# Patient Record
Sex: Female | Born: 1960 | Race: Black or African American | Hispanic: No | Marital: Single | Smoking: Never smoker
Health system: Southern US, Community
[De-identification: ages and names within clinical notes are randomized; demographics above are authoritative.]

## PROBLEM LIST (undated history)

## (undated) DIAGNOSIS — R011 Cardiac murmur, unspecified: Secondary | ICD-10-CM

## (undated) DIAGNOSIS — D219 Benign neoplasm of connective and other soft tissue, unspecified: Secondary | ICD-10-CM

## (undated) DIAGNOSIS — F32A Depression, unspecified: Secondary | ICD-10-CM

## (undated) DIAGNOSIS — G4730 Sleep apnea, unspecified: Secondary | ICD-10-CM

## (undated) DIAGNOSIS — I10 Essential (primary) hypertension: Secondary | ICD-10-CM

## (undated) DIAGNOSIS — E785 Hyperlipidemia, unspecified: Secondary | ICD-10-CM

## (undated) DIAGNOSIS — M1990 Unspecified osteoarthritis, unspecified site: Secondary | ICD-10-CM

## (undated) DIAGNOSIS — K219 Gastro-esophageal reflux disease without esophagitis: Secondary | ICD-10-CM

## (undated) DIAGNOSIS — F329 Major depressive disorder, single episode, unspecified: Secondary | ICD-10-CM

## (undated) DIAGNOSIS — J40 Bronchitis, not specified as acute or chronic: Secondary | ICD-10-CM

## (undated) DIAGNOSIS — F419 Anxiety disorder, unspecified: Secondary | ICD-10-CM

## (undated) HISTORY — DX: Anxiety disorder, unspecified: F41.9

## (undated) HISTORY — PX: CARPAL TUNNEL RELEASE: SHX101

## (undated) HISTORY — DX: Cardiac murmur, unspecified: R01.1

## (undated) HISTORY — PX: INDUCED ABORTION: SHX677

## (undated) HISTORY — PX: ABDOMINAL HYSTERECTOMY: SHX81

## (undated) HISTORY — DX: Essential (primary) hypertension: I10

## (undated) HISTORY — PX: TOTAL ABDOMINAL HYSTERECTOMY: SHX209

## (undated) HISTORY — DX: Unspecified osteoarthritis, unspecified site: M19.90

## (undated) HISTORY — DX: Hyperlipidemia, unspecified: E78.5

## (undated) HISTORY — DX: Sleep apnea, unspecified: G47.30

## (undated) HISTORY — DX: Gastro-esophageal reflux disease without esophagitis: K21.9

---

## 2011-05-16 ENCOUNTER — Inpatient Hospital Stay (HOSPITAL_COMMUNITY): Payer: Self-pay

## 2011-05-16 ENCOUNTER — Inpatient Hospital Stay (HOSPITAL_COMMUNITY)
Admission: AD | Admit: 2011-05-16 | Discharge: 2011-05-16 | Disposition: A | Discharge status: Home / Self Care | Payer: Self-pay | Source: Ambulatory Visit | Attending: Obstetrics and Gynecology | Admitting: Obstetrics and Gynecology

## 2011-05-16 ENCOUNTER — Encounter (HOSPITAL_COMMUNITY): Payer: Self-pay | Admitting: *Deleted

## 2011-05-16 DIAGNOSIS — Z90710 Acquired absence of both cervix and uterus: Secondary | ICD-10-CM | POA: Insufficient documentation

## 2011-05-16 DIAGNOSIS — M549 Dorsalgia, unspecified: Secondary | ICD-10-CM | POA: Insufficient documentation

## 2011-05-16 DIAGNOSIS — R1031 Right lower quadrant pain: Secondary | ICD-10-CM | POA: Insufficient documentation

## 2011-05-16 HISTORY — DX: Depression, unspecified: F32.A

## 2011-05-16 HISTORY — DX: Bronchitis, not specified as acute or chronic: J40

## 2011-05-16 HISTORY — DX: Benign neoplasm of connective and other soft tissue, unspecified: D21.9

## 2011-05-16 HISTORY — DX: Major depressive disorder, single episode, unspecified: F32.9

## 2011-05-16 LAB — URINALYSIS, ROUTINE W REFLEX MICROSCOPIC
Bilirubin Urine: NEGATIVE
Glucose, UA: NEGATIVE mg/dL
Hgb urine dipstick: NEGATIVE
Ketones, ur: NEGATIVE mg/dL
Leukocytes, UA: NEGATIVE
Nitrite: NEGATIVE
Protein, ur: NEGATIVE mg/dL
Specific Gravity, Urine: 1.01 (ref 1.005–1.030)
Urobilinogen, UA: 0.2 mg/dL (ref 0.0–1.0)
pH: 7 (ref 5.0–8.0)

## 2011-05-16 LAB — CBC
MCV: 87.9 fL (ref 78.0–100.0)
Platelets: 197 10*3/uL (ref 150–400)
RBC: 3.9 MIL/uL (ref 3.87–5.11)
WBC: 5.5 10*3/uL (ref 4.0–10.5)

## 2011-05-16 MED ORDER — KETOROLAC TROMETHAMINE 60 MG/2ML IM SOLN
60.0000 mg | Freq: Once | INTRAMUSCULAR | Status: AC
  Administered 2011-05-16: 60 mg via INTRAMUSCULAR
  Filled 2011-05-16: qty 2

## 2011-05-16 MED ORDER — CYCLOBENZAPRINE HCL 10 MG PO TABS: 10.0000 mg | ORAL_TABLET | Freq: Three times a day (TID) | ORAL | Status: AC | PRN

## 2011-05-16 NOTE — ED Provider Notes (Signed)
History     Chief Complaint  Patient presents with  . Abdominal Pain   HPI Ms. Carrender is a 50 yo [place] American women with PMH of HTN and PSH hysterectomy in 2000 for uterine fibroids who presents with one month history of RLQ pain.  Rates pain at 10/10 today.  States the pain began as RLQ pain but has now progressed to pain in her right lower back as well.  Reports the pain as constant since onset and describes it as intermittent sharp contraction-like pain.  Denies radiation of pain.  Denies any eliciting injury or event.  Reports some nausea but no vomiting.  Reports occasional urinary frequency since the onset of this pain but denies dysuria or urgency.  Denies vaginal bleeding, discharge, pruritis.  Denies diarrhea or constipation.  Pain relieved when laying on right side with pillow under the affected area.  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  Pain not relieved with Tylenol.  Patient was seen in Aug 2012 ED with similar complaint and states that no diagnosis was determined at that time, reports appendix was normal and [place] tech was unable to visual ovary.    OB History    Grav Para Term Preterm Abortions TAB SAB Ect Mult Living   3 1 1  0 2 2 0 0 0 1      Past Medical History  Diagnosis Date  . Bronchitis     reports hx of frequent  . Fibroid   . Depression     no meds    Past Surgical History  Procedure Date  . Abdominal hysterectomy   . Carpal tunnel release     bilateral  . Induced abortion     X2    No family history on file.  History  Substance Use Topics  . Smoking status: Never Smoker   . Smokeless tobacco: Not on file  . Alcohol Use: No    Allergies:  Allergies  Allergen Reactions  . Neurontin (Gabapentin) Rash    Prescriptions prior to admission  Medication Sig Dispense Refill  . Cholecalciferol (VITAMIN D-3 PO) Take 1 tablet by mouth 2 (two) times daily.          Review of Systems  Constitutional: Negative for fever, chills and  weight loss.  HENT: Negative.  Negative for neck pain.   Eyes: Negative.   Respiratory: Negative.   Cardiovascular: Negative.   Gastrointestinal: Positive for nausea, vomiting and abdominal pain (RLQ "contraction-like" pain). Negative for heartburn, diarrhea, constipation and blood in stool.  Genitourinary: Positive for frequency. Negative for dysuria, urgency, hematuria and flank pain.  Musculoskeletal: Positive for back pain (Right lumbosacral pain x34month). Negative for myalgias, joint pain and falls.  Skin: Negative.   Neurological: Negative.   Endo/Heme/Allergies: Negative.   Psychiatric/Behavioral: Negative.    Physical Exam   Blood pressure 150/93, pulse 67, temperature 98.8 F (37.1 C), temperature source Oral, resp. rate 20, height 6\' 1"  (1.854 m), weight 212 lb 6.4 oz (96.344 kg). CBC    Component Value Date/Time   WBC 5.5 05/16/2011 0930   RBC 3.90 05/16/2011 0930   HGB 11.7* 05/16/2011 0930   HCT 34.3* 05/16/2011 0930   PLT 197 05/16/2011 0930   MCV 87.9 05/16/2011 0930   MCH 30.0 05/16/2011 0930   MCHC 34.1 05/16/2011 0930   RDW 13.0 05/16/2011 0930    Physical Exam  Constitutional: She is oriented to person, place, and time. She appears well-developed and well-nourished. She appears distressed (  Patient appears in significant discomfort).  HENT:  Head: Normocephalic and atraumatic.  Eyes: Conjunctivae and EOM are normal. No scleral icterus.  Neck: Normal range of motion. Neck supple. No tracheal deviation present.  Cardiovascular: Normal rate, regular rhythm and intact distal pulses.  Exam reveals no gallop and no friction rub.   No murmur heard. Respiratory: Effort normal and breath sounds normal. No stridor. No respiratory distress. She has no wheezes. She has no rales.  GI: Soft. Bowel sounds are normal. She exhibits distension and mass (difficult to determine due to guarding ). There is tenderness. There is guarding. There is no rebound.         Patient tender to  palpation in RLQ.  Tender to both superficial and deep palpation.  Voluntary guarding in this region.  Unable to determine if there was mass in RLQ due to patient discomfort and guarding. Palpation of LLQ elicited some discomfort in RLQ.  RUQ and LUQ nontender to palpation.      Musculoskeletal: Normal range of motion. She exhibits tenderness (Tenderness in right lumbosacral region). She exhibits no edema.  Neurological: She is alert and oriented to person, place, and time. She has normal reflexes.  Skin: Skin is warm and dry.  Psychiatric: She has a normal mood and affect. Her behavior is normal. Judgment and thought content normal.   Transvaginal/Transabdominal Ultrasound: The uterus is surgically absent. Neither ovary is  identified and may also be surgically absent. There are no adnexal  masses or free pelvic fluid. Peristalsing bowel is seen within the  pelvis.   MAU Course  Procedures  MDM CBC: reveals no elevation in WBC or right shift.  Slight anemia with Hgb of 11.7 UA: reveals no signs of urinary tract infection Transabdominal and Transvaginal pelvic ultrasounds reveal surgically absent uterus and ovaries.  No free pelvic fluid.  Assessment and Plan  Ms. Attar is a 50 yo woman who presented to the MAU with a one month history of abdominal and back pain that was beginning to effect her ADLs. Lab testing and imaging reveal no pelvic or genitourinary etiology.  Suspect pain is due to musculoskeletal in origin.  Flexeril rx given and patient advised to follow-up with gyn or primary care if issue continues.     Clinton Gallant. Rice III, DrHSc, MPAS, PA-C  05/16/2011, 10:12 AM   Henrietta Hoover, PA 05/16/11 1735

## 2011-05-16 NOTE — ED Notes (Signed)
Rice PA in discussing results. And plan of care.

## 2011-05-16 NOTE — Progress Notes (Signed)
Went to ER in [place] twice last month, had US,CT and eval- nothing seen.  [facility] in [place].  Has appt at GYN clinic is 10/19, was told could come here prior to appt.  Pain initially in RLQ, now is around to back and is like a catch in her back.   Had hysterectomy ~2000, for fibroids, still has ovaries.

## 2011-05-16 NOTE — Progress Notes (Signed)
abd pain, first started in 2011, went to EDx1, then didn't bother for a while, returned in August this year.  Visits to [facility] ED x2 in Aug, 3 wks apart.

## 2011-05-16 NOTE — ED Notes (Signed)
Took hormones for a while, but a year after hysterectomy the doctor did blood work and she was "post-menopausal"

## 2011-05-24 NOTE — ED Provider Notes (Signed)
Agree with above note.  Denise Mejia 05/24/2011 9:17 AM   

## 2011-06-23 ENCOUNTER — Encounter: Payer: Self-pay | Admitting: Obstetrics and Gynecology

## 2011-08-02 ENCOUNTER — Encounter: Payer: Self-pay | Admitting: Advanced Practice Midwife

## 2013-02-08 DIAGNOSIS — G5600 Carpal tunnel syndrome, unspecified upper limb: Secondary | ICD-10-CM

## 2013-02-08 DIAGNOSIS — G43909 Migraine, unspecified, not intractable, without status migrainosus: Secondary | ICD-10-CM

## 2013-02-08 HISTORY — DX: Carpal tunnel syndrome, unspecified upper limb: G56.00

## 2013-02-08 HISTORY — DX: Migraine, unspecified, not intractable, without status migrainosus: G43.909

## 2013-04-09 ENCOUNTER — Ambulatory Visit: Payer: Self-pay | Admitting: Cardiology

## 2013-05-13 ENCOUNTER — Encounter: Payer: Self-pay | Admitting: Cardiology

## 2013-05-13 ENCOUNTER — Encounter: Payer: Self-pay | Admitting: *Deleted

## 2013-05-13 DIAGNOSIS — F329 Major depressive disorder, single episode, unspecified: Secondary | ICD-10-CM | POA: Insufficient documentation

## 2013-05-13 DIAGNOSIS — D219 Benign neoplasm of connective and other soft tissue, unspecified: Secondary | ICD-10-CM | POA: Insufficient documentation

## 2013-05-13 DIAGNOSIS — J40 Bronchitis, not specified as acute or chronic: Secondary | ICD-10-CM | POA: Insufficient documentation

## 2013-05-14 ENCOUNTER — Encounter: Payer: Self-pay | Admitting: Cardiology

## 2013-05-14 ENCOUNTER — Ambulatory Visit (INDEPENDENT_AMBULATORY_CARE_PROVIDER_SITE_OTHER): Payer: Self-pay | Admitting: Cardiology

## 2013-05-14 VITALS — BP 140/80 | HR 54 | Wt 228.0 lb

## 2013-05-14 DIAGNOSIS — R011 Cardiac murmur, unspecified: Secondary | ICD-10-CM

## 2013-05-14 DIAGNOSIS — R0609 Other forms of dyspnea: Secondary | ICD-10-CM

## 2013-05-14 DIAGNOSIS — I10 Essential (primary) hypertension: Secondary | ICD-10-CM | POA: Insufficient documentation

## 2013-05-14 DIAGNOSIS — R0600 Dyspnea, unspecified: Secondary | ICD-10-CM

## 2013-05-14 HISTORY — DX: Cardiac murmur, unspecified: R01.1

## 2013-05-14 HISTORY — DX: Essential (primary) hypertension: I10

## 2013-05-14 NOTE — Assessment & Plan Note (Signed)
No murmur noted on examination including with Valsalva. No further evaluation.

## 2013-05-14 NOTE — Progress Notes (Signed)
  HPI: 52 year old female with no prior cardiac history for evaluation of murmur. She has dyspnea with more extreme activities but not routine activities. No orthopnea, PND, pedal edema, palpitations, syncope or exertional chest pain.  Current Outpatient Prescriptions  Medication Sig Dispense Refill  . albuterol (VENTOLIN HFA) 108 (90 BASE) MCG/ACT inhaler Inhale 2 puffs into the lungs every 6 (six) hours as needed for wheezing.      . Cholecalciferol (VITAMIN D-3 PO) Take 1 tablet by mouth 2 (two) times daily.        . cloNIDine (CATAPRES) 0.1 MG tablet Take 0.1 mg by mouth at bedtime.      . metoprolol (LOPRESSOR) 50 MG tablet Take 50 mg by mouth 2 (two) times daily.      Marland Kitchen spironolactone (ALDACTONE) 25 MG tablet Take 25 mg by mouth daily.       No current facility-administered medications for this visit.    Allergies  Allergen Reactions  . Amlodipine   . Hctz [Hydrochlorothiazide]   . Lisinopril     cough  . Tramadol   . Neurontin [Gabapentin] Rash    Past Medical History  Diagnosis Date  . Bronchitis     reports hx of frequent  . Fibroid   . Depression     no meds  . Hypertension   . GERD (gastroesophageal reflux disease)     Past Surgical History  Procedure Laterality Date  . Abdominal hysterectomy    . Carpal tunnel release      bilateral  . Induced abortion      X2    History   Social History  . Marital Status: Married    Spouse Name: N/A    Number of Children: 1  . Years of Education: N/A   Occupational History  .      Nurses aide   Social History Main Topics  . Smoking status: Never Smoker   . Smokeless tobacco: Not on file  . Alcohol Use: No  . Drug Use: No  . Sexual Activity: Yes    Birth Control/ Protection: Surgical   Other Topics Concern  . Not on file   Social History Narrative  . No narrative on file    Family History  Problem Relation Age of Onset  . Stroke Mother     ROS: no fevers or chills, productive cough, hemoptysis,  dysphasia, odynophagia, melena, hematochezia, dysuria, hematuria, rash, seizure activity, orthopnea, PND, pedal edema, claudication. Remaining systems are negative.  Physical Exam:   Blood pressure 140/80, pulse 54, weight 228 lb (103.42 kg).  General:  Well developed/well nourished in NAD Skin warm/dry Patient not depressed No peripheral clubbing Back-normal HEENT-normal/normal eyelids Neck supple/normal carotid upstroke bilaterally; no bruits; no JVD; no thyromegaly chest - CTA/ normal expansion CV - RRR/normal S1 and S2; no murmurs, rubs or gallops;  PMI nondisplaced Abdomen -NT/ND, no HSM, no mass, + bowel sounds, no bruit 2+ femoral pulses, no bruits Ext-no edema, chords, 2+ DP Neuro-grossly nonfocal  ECG Sinus bradycardia at a rate of 54. Left ventricular hypertrophy. Nonspecific ST changes.

## 2013-05-14 NOTE — Assessment & Plan Note (Signed)
Blood pressure is controlled. Continue present medications. Left ventricular hypertrophy is noted on electrocardiogram and mild dyspnea with more extreme activities. Check echocardiogram for LV function and LV wall thickness.

## 2013-05-14 NOTE — Patient Instructions (Signed)
Your physician has requested that you have an echocardiogram. Echocardiography is a painless test that uses sound waves to create images of your heart. It provides your doctor with information about the size and shape of your heart and how well your heart's chambers and valves are working. This procedure takes approximately one hour. There are no restrictions for this procedure.  [address]  Your physician recommends that you continue on your current medications as directed. Please refer to the Current Medication list given to you today.   Follow up as needed

## 2013-05-28 ENCOUNTER — Other Ambulatory Visit (HOSPITAL_COMMUNITY): Payer: Self-pay

## 2014-02-18 ENCOUNTER — Emergency Department (HOSPITAL_BASED_OUTPATIENT_CLINIC_OR_DEPARTMENT_OTHER): Payer: No Typology Code available for payment source

## 2014-02-18 ENCOUNTER — Encounter (HOSPITAL_BASED_OUTPATIENT_CLINIC_OR_DEPARTMENT_OTHER): Payer: Self-pay | Admitting: Emergency Medicine

## 2014-02-18 ENCOUNTER — Emergency Department (HOSPITAL_BASED_OUTPATIENT_CLINIC_OR_DEPARTMENT_OTHER)
Admission: EM | Admit: 2014-02-18 | Discharge: 2014-02-18 | Disposition: A | Discharge status: Home / Self Care | Payer: No Typology Code available for payment source | Attending: Emergency Medicine | Admitting: Emergency Medicine

## 2014-02-18 DIAGNOSIS — J069 Acute upper respiratory infection, unspecified: Secondary | ICD-10-CM | POA: Insufficient documentation

## 2014-02-18 DIAGNOSIS — Z8719 Personal history of other diseases of the digestive system: Secondary | ICD-10-CM | POA: Insufficient documentation

## 2014-02-18 DIAGNOSIS — H9209 Otalgia, unspecified ear: Secondary | ICD-10-CM | POA: Insufficient documentation

## 2014-02-18 DIAGNOSIS — Z8709 Personal history of other diseases of the respiratory system: Secondary | ICD-10-CM | POA: Insufficient documentation

## 2014-02-18 DIAGNOSIS — Z8659 Personal history of other mental and behavioral disorders: Secondary | ICD-10-CM | POA: Insufficient documentation

## 2014-02-18 DIAGNOSIS — Z8742 Personal history of other diseases of the female genital tract: Secondary | ICD-10-CM | POA: Insufficient documentation

## 2014-02-18 DIAGNOSIS — I10 Essential (primary) hypertension: Secondary | ICD-10-CM | POA: Insufficient documentation

## 2014-02-18 DIAGNOSIS — Z79899 Other long term (current) drug therapy: Secondary | ICD-10-CM | POA: Insufficient documentation

## 2014-02-18 MED ORDER — BENZONATATE 100 MG PO CAPS: 100.0000 mg | ORAL_CAPSULE | Freq: Three times a day (TID) | ORAL | Status: DC

## 2014-02-18 MED ORDER — FLUTICASONE PROPIONATE 50 MCG/ACT NA SUSP: 1.0000 | Freq: Every day | NASAL | Status: DC

## 2014-02-18 MED ORDER — ALBUTEROL SULFATE HFA 108 (90 BASE) MCG/ACT IN AERS: 2.0000 | INHALATION_SPRAY | RESPIRATORY_TRACT | Status: DC | PRN

## 2014-02-18 MED ORDER — PREDNISONE 20 MG PO TABS: 40.0000 mg | ORAL_TABLET | Freq: Every day | ORAL | Status: DC

## 2014-02-18 NOTE — ED Notes (Signed)
Symptoms started with earache and then sore throat. Pt complaining now of sob and headache.

## 2014-02-18 NOTE — Discharge Instructions (Signed)
Upper Respiratory Infection, Adult  An upper respiratory infection (URI) is also known as the common cold. It is often caused by a type of germ (virus). Colds are easily spread (contagious). You can pass it to others by kissing, coughing, sneezing, or drinking out of the same glass. Usually, you get better in 1 or 2 weeks.   HOME CARE   · Only take medicine as told by your doctor.  · Use a warm mist humidifier or breathe in steam from a hot shower.  · Drink enough water and fluids to keep your pee (urine) clear or pale yellow.  · Get plenty of rest.  · Return to work when your temperature is back to normal or as told by your doctor. You may use a face mask and wash your hands to stop your cold from spreading.  GET HELP RIGHT AWAY IF:   · After the first few days, you feel you are getting worse.  · You have questions about your medicine.  · You have chills, shortness of breath, or brown or red spit (mucus).  · You have yellow or brown snot (nasal discharge) or pain in the face, especially when you bend forward.  · You have a fever, puffy (swollen) neck, pain when you swallow, or white spots in the back of your throat.  · You have a bad headache, ear pain, sinus pain, or chest pain.  · You have a high-pitched whistling sound when you breathe in and out (wheezing).  · You have a lasting cough or cough up blood.  · You have sore muscles or a stiff neck.  MAKE SURE YOU:   · Understand these instructions.  · Will watch your condition.  · Will get help right away if you are not doing well or get worse.  Document Released: 02/07/2008 Document Revised: 11/13/2011 Document Reviewed: 12/26/2010  ExitCare® Patient Information ©2015 [employer]. This information is not intended to replace advice given to you by your health care provider. Make sure you discuss any questions you have with your health care provider.  Antibiotic Nonuse   Your caregiver felt that the infection or problem was not one that would be helped with an  antibiotic.  Infections may be caused by viruses or bacteria. Only a caregiver can tell which one of these is the likely cause of an illness. A cold is the most common cause of infection in both adults and children. A cold is a virus. Antibiotic treatment will have no effect on a viral infection. Viruses can lead to many lost days of work caring for sick children and many missed days of school. Children may catch as many as 10 "colds" or "flus" per year during which they can be tearful, cranky, and uncomfortable. The goal of treating a virus is aimed at keeping the ill person comfortable.  Antibiotics are medications used to help the body fight bacterial infections. There are relatively few types of bacteria that cause infections but there are hundreds of viruses. While both viruses and bacteria cause infection they are very different types of germs. A viral infection will typically go away by itself within 7 to 10 days. Bacterial infections may spread or get worse without antibiotic treatment.  Examples of bacterial infections are:  · Sore throats (like strep throat or tonsillitis).  · Infection in the lung (pneumonia).  · Ear and skin infections.  Examples of viral infections are:  · Colds or flus.  · Most coughs and bronchitis.  ·   Sore throats not caused by Strep.  · Runny noses.  It is often best not to take an antibiotic when a viral infection is the cause of the problem. Antibiotics can kill off the helpful bacteria that we have inside our body and allow harmful bacteria to start growing. Antibiotics can cause side effects such as allergies, nausea, and diarrhea without helping to improve the symptoms of the viral infection. Additionally, repeated uses of antibiotics can cause bacteria inside of our body to become resistant. That resistance can be passed onto harmful bacterial. The next time you have an infection it may be harder to treat if antibiotics are used when they are not needed. Not treating with  antibiotics allows our own immune system to develop and take care of infections more efficiently. Also, antibiotics will work better for us when they are prescribed for bacterial infections.  Treatments for a child that is ill may include:  · Give extra fluids throughout the day to stay hydrated.  · Get plenty of rest.  · Only give your child over-the-counter or prescription medicines for pain, discomfort, or fever as directed by your caregiver.  · The use of a cool mist humidifier may help stuffy noses.  · Cold medications if suggested by your caregiver.  Your caregiver may decide to start you on an antibiotic if:  · The problem you were seen for today continues for a longer length of time than expected.  · You develop a secondary bacterial infection.  SEEK MEDICAL CARE IF:  · Fever lasts longer than 5 days.  · Symptoms continue to get worse after 5 to 7 days or become severe.  · Difficulty in breathing develops.  · Signs of dehydration develop (poor drinking, rare urinating, dark colored urine).  · Changes in behavior or worsening tiredness (listlessness or lethargy).  Document Released: 10/30/2001 Document Revised: 11/13/2011 Document Reviewed: 04/28/2009  ExitCare® Patient Information ©2015 [employer]. This information is not intended to replace advice given to you by your health care provider. Make sure you discuss any questions you have with your health care provider.

## 2014-02-18 NOTE — ED Provider Notes (Signed)
CSN: [id number]     Arrival date & time 02/18/14  0857 History   First MD Initiated Contact with Patient 02/18/14 [phone]     Chief Complaint  Patient presents with  . Shortness of Breath     (Consider location/radiation/quality/duration/timing/severity/associated sxs/prior Treatment) HPI Comments: The patient presented to the ER for evaluation of difficulty breathing. Patient reports that she has been sick for 2 or 3 days. She started with bilateral ear pain and then the next day developed a sore throat. Today she has had cough and felt short of breath. Patient does report a history of using an inhaler with upper respiratory infections, but no persistent asthma or COPD. She has not had a fever.  Patient is a 53 y.o. female presenting with shortness of breath.  Shortness of Breath Associated symptoms: cough, ear pain and sore throat     Past Medical History  Diagnosis Date  . Bronchitis     reports hx of frequent  . Fibroid   . Depression     pt states she does not have history of depresssion 02-18-2014  . Hypertension   . GERD (gastroesophageal reflux disease)    Past Surgical History  Procedure Laterality Date  . Abdominal hysterectomy    . Carpal tunnel release      bilateral  . Induced abortion      X2   Family History  Problem Relation Age of Onset  . Stroke Mother    History  Substance Use Topics  . Smoking status: Never Smoker   . Smokeless tobacco: Not on file  . Alcohol Use: No   OB History   Grav Para Term Preterm Abortions TAB SAB Ect Mult Living   3 1 1  0 2 2 0 0 0 1     Review of Systems  HENT: Positive for ear pain and sore throat.   Respiratory: Positive for cough and shortness of breath.   All other systems reviewed and are negative.     Allergies  Amlodipine; Hctz; Lisinopril; Tramadol; and Neurontin  Home Medications   Prior to Admission medications   Medication Sig Start Date End Date Taking? Authorizing Provider  albuterol (VENTOLIN  HFA) 108 (90 BASE) MCG/ACT inhaler Inhale 2 puffs into the lungs every 6 (six) hours as needed for wheezing.   Yes Historical Provider, MD  Cholecalciferol (VITAMIN D-3 PO) Take 1 tablet by mouth 2 (two) times daily.     Yes Historical Provider, MD  cloNIDine (CATAPRES) 0.1 MG tablet Take 0.1 mg by mouth at bedtime.   Yes Historical Provider, MD   BP 161/79  Pulse 92  Temp(Src) 98.6 F (37 C) (Oral)  Resp 18  Ht 6' (1.829 m)  Wt 228 lb (103.42 kg)  BMI 30.92 kg/m2  SpO2 100% Physical Exam  Constitutional: She is oriented to person, place, and time. She appears well-developed and well-nourished. No distress.  HENT:  Head: Normocephalic and atraumatic.  Right Ear: Hearing normal.  Left Ear: Hearing normal.  Nose: Nose normal.  Mouth/Throat: Oropharynx is clear and moist and mucous membranes are normal.  Eyes: Conjunctivae and EOM are normal. Pupils are equal, round, and reactive to light.  Neck: Normal range of motion. Neck supple.  Cardiovascular: Regular rhythm, S1 normal and S2 normal.  Exam reveals no gallop and no friction rub.   No murmur heard. Pulmonary/Chest: Effort normal and breath sounds normal. No respiratory distress. She exhibits no tenderness.  Abdominal: Soft. Normal appearance and bowel sounds are normal. There  is no hepatosplenomegaly. There is no tenderness. There is no rebound, no guarding, no tenderness at McBurney's point and negative Murphy's sign. No hernia.  Musculoskeletal: Normal range of motion.  Neurological: She is alert and oriented to person, place, and time. She has normal strength. No cranial nerve deficit or sensory deficit. Coordination normal. GCS eye subscore is 4. GCS verbal subscore is 5. GCS motor subscore is 6.  Skin: Skin is warm, dry and intact. No rash noted. No cyanosis.  Psychiatric: She has a normal mood and affect. Her speech is normal and behavior is normal. Thought content normal.    ED Course  Procedures (including critical care  time) Labs Review Labs Reviewed - No data to display  Imaging Review Dg Chest 2 View  02/18/2014   CLINICAL DATA:  Cough.  EXAM: CHEST  2 VIEW  COMPARISON:  11/20/2013 .  FINDINGS: Mediastinum hilar structures normal. Lungs are clear. Heart size normal. No pleural effusion or pneumothorax.  IMPRESSION: No acute cardiopulmonary disease.   Electronically Signed   By: Marcello Moores  Register   On: 02/18/2014 09:35     EKG Interpretation None      MDM   Final diagnoses:  None  URI  Patient presents to ER for evaluation for respiratory symptoms. She started with ear pain, followed by sore throat and now chest congestion and difficulty breathing. She reports a history of asthmatic bronchitis, but no ongoing issues of asthma or COPD. Patient does not have active bronchospasm on examination. Oxygenation is 100%. Based on the concomitant upper aspect of her symptoms including sore throat, ear pain and congestion, PE is not considered likely. There is nothing to suggest acute coronary syndrome or cardiac etiology of her shortness of breath. Chest x-ray was obtained and there is no evidence of pneumonia. Patient will be treated with prednisone and bronchodilator therapy, symptomatic treatment for cough and congestion.    Orpah Greek, MD 02/18/14 [phone]

## 2014-07-06 ENCOUNTER — Encounter (HOSPITAL_BASED_OUTPATIENT_CLINIC_OR_DEPARTMENT_OTHER): Payer: Self-pay | Admitting: Emergency Medicine

## 2014-08-19 ENCOUNTER — Encounter: Payer: No Typology Code available for payment source | Admitting: Cardiology

## 2014-08-19 NOTE — Progress Notes (Signed)
      HPI: follow-up hypertension. Last seen in September 2014. Echocardiogram ordered but not performed.   Current Outpatient Prescriptions  Medication Sig Dispense Refill  . albuterol (PROVENTIL HFA;VENTOLIN HFA) 108 (90 BASE) MCG/ACT inhaler Inhale 2 puffs into the lungs every 4 (four) hours as needed for wheezing or shortness of breath. 1 Inhaler 0  . albuterol (VENTOLIN HFA) 108 (90 BASE) MCG/ACT inhaler Inhale 2 puffs into the lungs every 6 (six) hours as needed for wheezing.    . benzonatate (TESSALON) 100 MG capsule Take 1 capsule (100 mg total) by mouth every 8 (eight) hours. 21 capsule 0  . Cholecalciferol (VITAMIN D-3 PO) Take 1 tablet by mouth 2 (two) times daily.      . cloNIDine (CATAPRES) 0.1 MG tablet Take 0.1 mg by mouth at bedtime.    . fluticasone (FLONASE) 50 MCG/ACT nasal spray Place 1 spray into both nostrils daily. 16 g 0  . predniSONE (DELTASONE) 20 MG tablet Take 2 tablets (40 mg total) by mouth daily with breakfast. 10 tablet 0   No current facility-administered medications for this visit.     Past Medical History  Diagnosis Date  . Bronchitis     reports hx of frequent  . Fibroid   . Depression     pt states she does not have history of depresssion 02-18-2014  . Hypertension   . GERD (gastroesophageal reflux disease)     Past Surgical History  Procedure Laterality Date  . Abdominal hysterectomy    . Carpal tunnel release      bilateral  . Induced abortion      X2    History   Social History  . Marital Status: Married    Spouse Name: N/A    Number of Children: 1  . Years of Education: N/A   Occupational History  .      Nurses aide   Social History Main Topics  . Smoking status: Never Smoker   . Smokeless tobacco: Not on file  . Alcohol Use: No  . Drug Use: No  . Sexual Activity: Yes    Birth Control/ Protection: Surgical   Other Topics Concern  . Not on file   Social History Narrative    ROS: no fevers or chills, productive  cough, hemoptysis, dysphasia, odynophagia, melena, hematochezia, dysuria, hematuria, rash, seizure activity, orthopnea, PND, pedal edema, claudication. Remaining systems are negative.  Physical Exam: Well-developed well-nourished in no acute distress.  Skin is warm and dry.  HEENT is normal.  Neck is supple.  Chest is clear to auscultation with normal expansion.  Cardiovascular exam is regular rate and rhythm.  Abdominal exam nontender or distended. No masses palpated. Extremities show no edema. neuro grossly intact  ECG     This encounter was created in error - please disregard.

## 2014-09-08 ENCOUNTER — Encounter: Payer: Self-pay | Admitting: Cardiology

## 2014-12-10 ENCOUNTER — Emergency Department (HOSPITAL_BASED_OUTPATIENT_CLINIC_OR_DEPARTMENT_OTHER)
Admission: EM | Admit: 2014-12-10 | Discharge: 2014-12-10 | Disposition: A | Discharge status: Home / Self Care | Payer: 59 | Attending: Emergency Medicine | Admitting: Emergency Medicine

## 2014-12-10 ENCOUNTER — Encounter (HOSPITAL_BASED_OUTPATIENT_CLINIC_OR_DEPARTMENT_OTHER): Payer: Self-pay | Admitting: *Deleted

## 2014-12-10 DIAGNOSIS — Z79899 Other long term (current) drug therapy: Secondary | ICD-10-CM | POA: Diagnosis not present

## 2014-12-10 DIAGNOSIS — Z8659 Personal history of other mental and behavioral disorders: Secondary | ICD-10-CM | POA: Diagnosis not present

## 2014-12-10 DIAGNOSIS — Z8709 Personal history of other diseases of the respiratory system: Secondary | ICD-10-CM | POA: Diagnosis not present

## 2014-12-10 DIAGNOSIS — K2100 Gastro-esophageal reflux disease with esophagitis, without bleeding: Secondary | ICD-10-CM

## 2014-12-10 DIAGNOSIS — Z86018 Personal history of other benign neoplasm: Secondary | ICD-10-CM | POA: Diagnosis not present

## 2014-12-10 DIAGNOSIS — Z7951 Long term (current) use of inhaled steroids: Secondary | ICD-10-CM | POA: Diagnosis not present

## 2014-12-10 DIAGNOSIS — R109 Unspecified abdominal pain: Secondary | ICD-10-CM | POA: Diagnosis present

## 2014-12-10 DIAGNOSIS — I10 Essential (primary) hypertension: Secondary | ICD-10-CM | POA: Diagnosis not present

## 2014-12-10 LAB — COMPREHENSIVE METABOLIC PANEL
ALK PHOS: 90 U/L (ref 39–117)
ALT: 17 U/L (ref 0–35)
AST: 16 U/L (ref 0–37)
Albumin: 4.2 g/dL (ref 3.5–5.2)
Anion gap: 6 (ref 5–15)
BILIRUBIN TOTAL: 0.1 mg/dL — AB (ref 0.3–1.2)
BUN: 15 mg/dL (ref 6–23)
CHLORIDE: 105 mmol/L (ref 96–112)
CO2: 27 mmol/L (ref 19–32)
CREATININE: 1.12 mg/dL — AB (ref 0.50–1.10)
Calcium: 9.1 mg/dL (ref 8.4–10.5)
GFR calc Af Amer: 65 mL/min — ABNORMAL LOW (ref >90)
GFR, EST NON AFRICAN AMERICAN: 56 mL/min — AB (ref >90)
Glucose, Bld: 108 mg/dL — ABNORMAL HIGH (ref 70–99)
Potassium: 3.8 mmol/L (ref 3.5–5.1)
Sodium: 138 mmol/L (ref 135–145)
Total Protein: 7.7 g/dL (ref 6.0–8.3)

## 2014-12-10 LAB — CBC WITH DIFFERENTIAL/PLATELET
Basophils Absolute: 0 10*3/uL (ref 0.0–0.1)
Basophils Relative: 1 % (ref 0–1)
EOS PCT: 2 % (ref 0–5)
Eosinophils Absolute: 0.1 10*3/uL (ref 0.0–0.7)
HCT: 34.3 % — ABNORMAL LOW (ref 36.0–46.0)
HEMOGLOBIN: 11.5 g/dL — AB (ref 12.0–15.0)
LYMPHS ABS: 3.4 10*3/uL (ref 0.7–4.0)
LYMPHS PCT: 48 % — AB (ref 12–46)
MCH: 29.8 pg (ref 26.0–34.0)
MCHC: 33.5 g/dL (ref 30.0–36.0)
MCV: 88.9 fL (ref 78.0–100.0)
Monocytes Absolute: 0.4 10*3/uL (ref 0.1–1.0)
Monocytes Relative: 6 % (ref 3–12)
NEUTROS ABS: 3 10*3/uL (ref 1.7–7.7)
NEUTROS PCT: 43 % (ref 43–77)
PLATELETS: 231 10*3/uL (ref 150–400)
RBC: 3.86 MIL/uL — AB (ref 3.87–5.11)
RDW: 12.7 % (ref 11.5–15.5)
WBC: 6.9 10*3/uL (ref 4.0–10.5)

## 2014-12-10 LAB — LIPASE, BLOOD: LIPASE: 28 U/L (ref 11–59)

## 2014-12-10 LAB — TROPONIN I: Troponin I: <0.03 ng/mL (ref <0.031)

## 2014-12-10 MED ORDER — RANITIDINE HCL 150 MG/10ML PO SYRP
150.0000 mg | ORAL_SOLUTION | Freq: Once | ORAL | Status: AC
  Administered 2014-12-10: 150 mg via ORAL
  Filled 2014-12-10: qty 10

## 2014-12-10 NOTE — Discharge Instructions (Signed)
Taken 150 mg of Zantac twice a day for 7-10 days.  After symptoms gone you can take Nexium every other day for preventive medicine.   Food Choices for Gastroesophageal Reflux Disease When you have gastroesophageal reflux disease (GERD), the foods you eat and your eating habits are very important. Choosing the right foods can help ease the discomfort of GERD. WHAT GENERAL GUIDELINES DO I NEED TO FOLLOW?  Choose fruits, vegetables, whole grains, low-fat dairy products, and low-fat meat, fish, and poultry.  Limit fats such as oils, salad dressings, butter, nuts, and avocado.  Keep a food diary to identify foods that cause symptoms.  Avoid foods that cause reflux. These may be different for different people.  Eat frequent small meals instead of three large meals each day.  Eat your meals slowly, in a relaxed setting.  Limit fried foods.  Cook foods using methods other than frying.  Avoid drinking alcohol.  Avoid drinking large amounts of liquids with your meals.  Avoid bending over or lying down until 2-3 hours after eating. WHAT FOODS ARE NOT RECOMMENDED? The following are some foods and drinks that may worsen your symptoms: Vegetables Tomatoes. Tomato juice. Tomato and spaghetti sauce. Chili peppers. Onion and garlic. Horseradish. Fruits Oranges, grapefruit, and lemon (fruit and juice). Meats High-fat meats, fish, and poultry. This includes hot dogs, ribs, ham, sausage, salami, and bacon. Dairy Whole milk and chocolate milk. Sour cream. Cream. Butter. Ice cream. Cream cheese.  Beverages Coffee and tea, with or without caffeine. Carbonated beverages or energy drinks. Condiments Hot sauce. Barbecue sauce.  Sweets/Desserts Chocolate and cocoa. Donuts. Peppermint and spearmint. Fats and Oils High-fat foods, including [place] fries and potato chips. Other Vinegar. Strong spices, such as black pepper, white pepper, red pepper, cayenne, curry powder, cloves, ginger, and chili  powder. The items listed above may not be a complete list of foods and beverages to avoid. Contact your dietitian for more information. Document Released: 08/21/2005 Document Revised: 08/26/2013 Document Reviewed: 06/25/2013 [facility] Patient Information 2015 [place], [place]. This information is not intended to replace advice given to you by your health care provider. Make sure you discuss any questions you have with your health care provider.  Esophagitis Esophagitis is inflammation of the esophagus. It can involve swelling, soreness, and pain in the esophagus. This condition can make it difficult and painful to swallow. CAUSES  Most causes of esophagitis are not serious. Many different factors can cause esophagitis, including:  Gastroesophageal reflux disease (GERD). This is when acid from your stomach flows up into the esophagus.  Recurrent vomiting.  An allergic-type reaction.  Certain medicines, especially those that come in large pills.  Ingestion of harmful chemicals, such as household cleaning products.  Heavy alcohol use.  An infection of the esophagus.  Radiation treatment for cancer.  Certain diseases such as sarcoidosis, Crohn's disease, and scleroderma. These diseases may cause recurrent esophagitis. SYMPTOMS   Trouble swallowing.  Painful swallowing.  Chest pain.  Difficulty breathing.  Nausea.  Vomiting.  Abdominal pain. DIAGNOSIS  Your caregiver will take your history and do a physical exam. Depending upon what your caregiver finds, certain tests may also be done, including:  Barium X-ray. You will drink a solution that coats the esophagus, and X-rays will be taken.  Endoscopy. A lighted tube is put down the esophagus so your caregiver can examine the area.  Allergy tests. These can sometimes be arranged through follow-up visits. TREATMENT  Treatment will depend on the cause of your esophagitis. In some cases,  steroids or other medicines may be given  to help relieve your symptoms or to treat the underlying cause of your condition. Medicines that may be recommended include:  Viscous lidocaine, to soothe the esophagus.  Antacids.  Acid reducers.  Proton pump inhibitors.  Antiviral medicines for certain viral infections of the esophagus.  Antifungal medicines for certain fungal infections of the esophagus.  Antibiotic medicines, depending on the cause of the esophagitis. HOME CARE INSTRUCTIONS   Avoid foods and drinks that seem to make your symptoms worse.  Eat small, frequent meals instead of large meals.  Avoid eating for the 3 hours prior to your bedtime.  If you have trouble taking pills, use a pill splitter to decrease the size and likelihood of the pill getting stuck or injuring the esophagus on the way down. Drinking water after taking a pill also helps.  Stop smoking if you smoke.  Maintain a healthy weight.  Wear loose-fitting clothing. Do not wear anything tight around your waist that causes pressure on your stomach.  Raise the head of your bed 6 to 8 inches with wood blocks to help you sleep. Extra pillows will not help.  Only take over-the-counter or prescription medicines as directed by your caregiver. SEEK IMMEDIATE MEDICAL CARE IF:  You have severe chest pain that radiates into your arm, neck, or jaw.  You feel sweaty, dizzy, or lightheaded.  You have shortness of breath.  You vomit blood.  You have difficulty or pain with swallowing.  You have bloody or black, tarry stools.  You have a fever.  You have a burning sensation in the chest more than 3 times a week for more than 2 weeks.  You cannot swallow, drink, or eat.  You drool because you cannot swallow your saliva. MAKE SURE YOU:  Understand these instructions.  Will watch your condition.  Will get help right away if you are not doing well or get worse. Document Released: 09/28/2004 Document Revised: 11/13/2011 Document Reviewed:  04/21/2011 [facility] Patient Information 2015 [place], [place]. This information is not intended to replace advice given to you by your health care provider. Make sure you discuss any questions you have with your health care provider.

## 2014-12-10 NOTE — ED Notes (Signed)
MD at bedside. 

## 2014-12-10 NOTE — ED Provider Notes (Signed)
CSN: [id number]     Arrival date & time 12/10/14  1919 History  This chart was scribed for Denise Schwartz, MD by Steva Colder, ED Scribe. The patient was seen in room MH05/MH05 at 7:44 PM.     Chief Complaint  Patient presents with  . Abdominal Pain      The history is provided by the patient. No language interpreter was used.    HPI Comments: Denise Mejia is a 54 y.o. female with a medical hx of GERD and HTN, who presents to the Emergency Department complaining of epigastric abdominal pain onset 2 weeks. Pt was given 40 mg of omeprazole for the burning sensation that she has had since last week. Pt has also taken liquid mylantin that aided in mild relief of her symptoms. pt feels as if she has something stuck in her throat and that if she drinks water it will push it down. She denies any other symptoms. Pt takes daily medications for her HTN and she takes clonidine. Pt denies abdominal surgery. Pt has had an hysterectomy. Pt was seeing Dr. Joaquim Lai awhile ago for her GERD and was Rx Nexium.   Past Medical History  Diagnosis Date  . Bronchitis     reports hx of frequent  . Fibroid   . Depression     pt states she does not have history of depresssion 02-18-2014  . Hypertension   . GERD (gastroesophageal reflux disease)    Past Surgical History  Procedure Laterality Date  . Abdominal hysterectomy    . Carpal tunnel release      bilateral  . Induced abortion      X2   Family History  Problem Relation Age of Onset  . Stroke Mother    History  Substance Use Topics  . Smoking status: Never Smoker   . Smokeless tobacco: Not on file  . Alcohol Use: No   OB History    Gravida Para Term Preterm AB TAB SAB Ectopic Multiple Living   3 1 1  0 2 2 0 0 0 1     Review of Systems  Gastrointestinal: Positive for abdominal pain.       Burning sensation  All other systems reviewed and are negative.     Allergies  Amlodipine; Hctz; Lisinopril; Tramadol; and Neurontin  Home Medications    Prior to Admission medications   Medication Sig Start Date End Date Taking? Authorizing Provider  NIFEDIPINE PO Take by mouth.   Yes Historical Provider, MD  SPIRONOLACTONE PO Take by mouth.   Yes Historical Provider, MD  albuterol (PROVENTIL HFA;VENTOLIN HFA) 108 (90 BASE) MCG/ACT inhaler Inhale 2 puffs into the lungs every 4 (four) hours as needed for wheezing or shortness of breath. 02/18/14   Orpah Greek, MD  albuterol (VENTOLIN HFA) 108 (90 BASE) MCG/ACT inhaler Inhale 2 puffs into the lungs every 6 (six) hours as needed for wheezing.    Historical Provider, MD  benzonatate (TESSALON) 100 MG capsule Take 1 capsule (100 mg total) by mouth every 8 (eight) hours. 02/18/14   Orpah Greek, MD  Cholecalciferol (VITAMIN D-3 PO) Take 1 tablet by mouth 2 (two) times daily.      Historical Provider, MD  cloNIDine (CATAPRES) 0.1 MG tablet Take 0.1 mg by mouth at bedtime.    Historical Provider, MD  fluticasone (FLONASE) 50 MCG/ACT nasal spray Place 1 spray into both nostrils daily. 02/18/14   Orpah Greek, MD  predniSONE (DELTASONE) 20 MG tablet Take 2 tablets (40  mg total) by mouth daily with breakfast. 02/18/14   Orpah Greek, MD   BP 151/72 mmHg  Pulse 76  Temp(Src) 98.5 F (36.9 C) (Oral)  Resp 20  Ht 6\' 1"  (1.854 m)  Wt 226 lb (102.513 kg)  BMI 29.82 kg/m2  SpO2 100%  Physical Exam Physical Exam  Nursing note and vitals reviewed. Constitutional: She is oriented to person, place, and time. She appears well-developed and well-nourished. No distress.  HENT:  Head: Normocephalic and atraumatic.  Eyes: Pupils are equal, round, and reactive to light.  Neck: Normal range of motion.  Cardiovascular: Normal rate and intact distal pulses.   Pulmonary/Chest: No respiratory distress.  Abdominal: Normal appearance. She exhibits no distension.  Musculoskeletal: Normal range of motion.  Neurological: She is alert and oriented to person, place, and time. No  cranial nerve deficit.  Skin: Skin is warm and dry. No rash noted.  Psychiatric: She has a normal mood and affect. Her behavior is normal.   ED Course  Procedures (including critical care time) DIAGNOSTIC STUDIES: Oxygen Saturation is 100% on RA, nl by my interpretation.    COORDINATION OF CARE: 7:48 PM-Discussed treatment plan which includes zantac Rx, Nexium Rx, CMAT, CBC, Troponin, EKG with pt at bedside and pt agreed to plan.   Labs Review Labs Reviewed  COMPREHENSIVE METABOLIC PANEL - Abnormal; Notable for the following:    Glucose, Bld 108 (*)    Creatinine, Ser 1.12 (*)    Total Bilirubin 0.1 (*)    GFR calc non Af Amer 56 (*)    GFR calc Af Amer 65 (*)    All other components within normal limits  CBC WITH DIFFERENTIAL/PLATELET - Abnormal; Notable for the following:    RBC 3.86 (*)    Hemoglobin 11.5 (*)    HCT 34.3 (*)    Lymphocytes Relative 48 (*)    All other components within normal limits  LIPASE, BLOOD  TROPONIN I    Imaging Review No results found.   EKG Interpretation   Date/Time:  Thursday December 10 2014 19:39:23 EDT Ventricular Rate:  85 PR Interval:  152 QRS Duration: 82 QT Interval:  374 QTC Calculation: 445 R Axis:   -6 Text Interpretation:  Normal sinus rhythm Minimal voltage criteria for  LVH, may be normal variant Nonspecific T wave abnormality Abnormal ECG No  previous tracing Confirmed by Maryruth Apple  MD, Kristeen Lantz ([id number]) on 12/10/2014  7:42:38 PM      MDM   Final diagnoses:  Gastroesophageal reflux disease with esophagitis   I personally performed the services described in this documentation, which was scribed in my presence. The recorded information has been reviewed and considered.    Denise Schwartz, MD 12/15/14 [phone]

## 2014-12-10 NOTE — ED Notes (Signed)
Epigastric pain x 2 weeks.

## 2015-02-03 DIAGNOSIS — K219 Gastro-esophageal reflux disease without esophagitis: Secondary | ICD-10-CM | POA: Insufficient documentation

## 2015-02-10 ENCOUNTER — Encounter (HOSPITAL_BASED_OUTPATIENT_CLINIC_OR_DEPARTMENT_OTHER): Payer: Self-pay | Admitting: *Deleted

## 2015-02-10 ENCOUNTER — Emergency Department (HOSPITAL_BASED_OUTPATIENT_CLINIC_OR_DEPARTMENT_OTHER)
Admission: EM | Admit: 2015-02-10 | Discharge: 2015-02-10 | Disposition: A | Discharge status: Home / Self Care | Payer: 59 | Attending: Emergency Medicine | Admitting: Emergency Medicine

## 2015-02-10 DIAGNOSIS — R630 Anorexia: Secondary | ICD-10-CM | POA: Diagnosis not present

## 2015-02-10 DIAGNOSIS — Z8719 Personal history of other diseases of the digestive system: Secondary | ICD-10-CM | POA: Insufficient documentation

## 2015-02-10 DIAGNOSIS — F419 Anxiety disorder, unspecified: Secondary | ICD-10-CM | POA: Diagnosis not present

## 2015-02-10 DIAGNOSIS — Z7952 Long term (current) use of systemic steroids: Secondary | ICD-10-CM | POA: Insufficient documentation

## 2015-02-10 DIAGNOSIS — Z8742 Personal history of other diseases of the female genital tract: Secondary | ICD-10-CM | POA: Diagnosis not present

## 2015-02-10 DIAGNOSIS — F329 Major depressive disorder, single episode, unspecified: Secondary | ICD-10-CM | POA: Insufficient documentation

## 2015-02-10 DIAGNOSIS — Z7951 Long term (current) use of inhaled steroids: Secondary | ICD-10-CM | POA: Diagnosis not present

## 2015-02-10 DIAGNOSIS — R5383 Other fatigue: Secondary | ICD-10-CM | POA: Diagnosis present

## 2015-02-10 DIAGNOSIS — Z86018 Personal history of other benign neoplasm: Secondary | ICD-10-CM | POA: Insufficient documentation

## 2015-02-10 DIAGNOSIS — G479 Sleep disorder, unspecified: Secondary | ICD-10-CM | POA: Insufficient documentation

## 2015-02-10 DIAGNOSIS — I10 Essential (primary) hypertension: Secondary | ICD-10-CM | POA: Insufficient documentation

## 2015-02-10 DIAGNOSIS — R51 Headache: Secondary | ICD-10-CM | POA: Diagnosis not present

## 2015-02-10 DIAGNOSIS — Z8709 Personal history of other diseases of the respiratory system: Secondary | ICD-10-CM | POA: Diagnosis not present

## 2015-02-10 DIAGNOSIS — Z79899 Other long term (current) drug therapy: Secondary | ICD-10-CM | POA: Insufficient documentation

## 2015-02-10 DIAGNOSIS — F32A Depression, unspecified: Secondary | ICD-10-CM

## 2015-02-10 LAB — CBC WITH DIFFERENTIAL/PLATELET
Basophils Absolute: 0 10*3/uL (ref 0.0–0.1)
Basophils Relative: 0 % (ref 0–1)
Eosinophils Absolute: 0.1 10*3/uL (ref 0.0–0.7)
Eosinophils Relative: 2 % (ref 0–5)
HEMATOCRIT: 33.3 % — AB (ref 36.0–46.0)
Hemoglobin: 11 g/dL — ABNORMAL LOW (ref 12.0–15.0)
LYMPHS ABS: 3.4 10*3/uL (ref 0.7–4.0)
LYMPHS PCT: 44 % (ref 12–46)
MCH: 29.7 pg (ref 26.0–34.0)
MCHC: 33 g/dL (ref 30.0–36.0)
MCV: 90 fL (ref 78.0–100.0)
MONO ABS: 0.5 10*3/uL (ref 0.1–1.0)
Monocytes Relative: 7 % (ref 3–12)
NEUTROS PCT: 47 % (ref 43–77)
Neutro Abs: 3.6 10*3/uL (ref 1.7–7.7)
Platelets: 211 10*3/uL (ref 150–400)
RBC: 3.7 MIL/uL — ABNORMAL LOW (ref 3.87–5.11)
RDW: 12.5 % (ref 11.5–15.5)
WBC: 7.6 10*3/uL (ref 4.0–10.5)

## 2015-02-10 LAB — COMPREHENSIVE METABOLIC PANEL
ALBUMIN: 4.1 g/dL (ref 3.5–5.0)
ALK PHOS: 104 U/L (ref 38–126)
ALT: 14 U/L (ref 14–54)
AST: 15 U/L (ref 15–41)
Anion gap: 3 — ABNORMAL LOW (ref 5–15)
BILIRUBIN TOTAL: 0.4 mg/dL (ref 0.3–1.2)
BUN: 12 mg/dL (ref 6–20)
CALCIUM: 8.6 mg/dL — AB (ref 8.9–10.3)
CHLORIDE: 108 mmol/L (ref 101–111)
CO2: 28 mmol/L (ref 22–32)
Creatinine, Ser: 0.98 mg/dL (ref 0.44–1.00)
GFR calc Af Amer: >60 mL/min (ref >60)
GLUCOSE: 137 mg/dL — AB (ref 65–99)
POTASSIUM: 3.3 mmol/L — AB (ref 3.5–5.1)
SODIUM: 139 mmol/L (ref 135–145)
TOTAL PROTEIN: 7.3 g/dL (ref 6.5–8.1)

## 2015-02-10 LAB — URINALYSIS, ROUTINE W REFLEX MICROSCOPIC
Bilirubin Urine: NEGATIVE
GLUCOSE, UA: NEGATIVE mg/dL
Hgb urine dipstick: NEGATIVE
KETONES UR: NEGATIVE mg/dL
Leukocytes, UA: NEGATIVE
NITRITE: NEGATIVE
PH: 6.5 (ref 5.0–8.0)
Protein, ur: NEGATIVE mg/dL
Specific Gravity, Urine: 1.004 — ABNORMAL LOW (ref 1.005–1.030)
Urobilinogen, UA: 1 mg/dL (ref 0.0–1.0)

## 2015-02-10 NOTE — Discharge Instructions (Signed)
Resume your Zoloft prescription. Take the dose 2 hours before bed. Only take Klonopin as needed for severe anxiety. It can cause somnolence and fatigue Follow-up with your primary care physician.    Major Depressive Disorder Major depressive disorder is a mental illness. It also may be called clinical depression or unipolar depression. Major depressive disorder usually causes feelings of sadness, hopelessness, or helplessness. Some people with this disorder do not feel particularly sad but lose interest in doing things they used to enjoy (anhedonia). Major depressive disorder also can cause physical symptoms. It can interfere with work, school, relationships, and other normal everyday activities. The disorder varies in severity but is longer lasting and more serious than the sadness we all feel from time to time in our lives. Major depressive disorder often is triggered by stressful life events or major life changes. Examples of these triggers include divorce, loss of your job or home, a move, and the death of a family member or close friend. Sometimes this disorder occurs for no obvious reason at all. People who have family members with major depressive disorder or bipolar disorder are at higher risk for developing this disorder, with or without life stressors. Major depressive disorder can occur at any age. It may occur just once in your life (single episode major depressive disorder). It may occur multiple times (recurrent major depressive disorder). SYMPTOMS People with major depressive disorder have either anhedonia or depressed mood on nearly a daily basis for at least 2 weeks or longer. Symptoms of depressed mood include:  Feelings of sadness (blue or down in the dumps) or emptiness.  Feelings of hopelessness or helplessness.  Tearfulness or episodes of crying (may be observed by others).  Irritability (children and adolescents). In addition to depressed mood or anhedonia or both, people  with this disorder have at least four of the following symptoms:  Difficulty sleeping or sleeping too much.   Significant change (increase or decrease) in appetite or weight.   Lack of energy or motivation.  Feelings of guilt and worthlessness.   Difficulty concentrating, remembering, or making decisions.  Unusually slow movement (psychomotor retardation) or restlessness (as observed by others).   Recurrent wishes for death, recurrent thoughts of self-harm (suicide), or a suicide attempt. People with major depressive disorder commonly have persistent negative thoughts about themselves, other people, and the world. People with severe major depressive disorder may experiencedistorted beliefs or perceptions about the world (psychotic delusions). They also may see or hear things that are not real (psychotic hallucinations). DIAGNOSIS Major depressive disorder is diagnosed through an assessment by your health care provider. Your health care provider will ask aboutaspects of your daily life, such as mood,sleep, and appetite, to see if you have the diagnostic symptoms of major depressive disorder. Your health care provider may ask about your medical history and use of alcohol or drugs, including prescription medicines. Your health care provider also may do a physical exam and blood work. This is because certain medical conditions and the use of certain substances can cause major depressive disorder-like symptoms (secondary depression). Your health care provider also may refer you to a mental health specialist for further evaluation and treatment. TREATMENT It is important to recognize the symptoms of major depressive disorder and seek treatment. The following treatments can be prescribed for this disorder:   Medicine. Antidepressant medicines usually are prescribed. Antidepressant medicines are thought to correct chemical imbalances in the brain that are commonly associated with major  depressive disorder. Other types of medicine may  be added if the symptoms do not respond to antidepressant medicines alone or if psychotic delusions or hallucinations occur.  Talk therapy. Talk therapy can be helpful in treating major depressive disorder by providing support, education, and guidance. Certain types of talk therapy also can help with negative thinking (cognitive behavioral therapy) and with relationship issues that trigger this disorder (interpersonal therapy). A mental health specialist can help determine which treatment is best for you. Most people with major depressive disorder do well with a combination of medicine and talk therapy. Treatments involving electrical stimulation of the brain can be used in situations with extremely severe symptoms or when medicine and talk therapy do not work over time. These treatments include electroconvulsive therapy, transcranial magnetic stimulation, and vagal nerve stimulation. Document Released: 12/16/2012 Document Revised: 01/05/2014 Document Reviewed: 12/16/2012 [facility] Patient Information 2015 [place], [place]. This information is not intended to replace advice given to you by your health care provider. Make sure you discuss any questions you have with your health care provider.

## 2015-02-10 NOTE — ED Notes (Addendum)
Pt sts she has not felt "like herself" for a couple of weeks. She c/o intermittent dizziness and increased anxiety lately. Pt denies any pain. She also reports that her vision is more blurry than usual. Pt was seen at the [facility] for same 3 weeks ago and all tests were negative.

## 2015-02-10 NOTE — ED Provider Notes (Signed)
CSN: [id number]     Arrival date & time 02/10/15  1607 History   First MD Initiated Contact with Patient 02/10/15 1622     Chief Complaint  Patient presents with  . Fatigue      HPI  Impression presents for evaluation with complaint of "I just haven't been feeling myself". She describes 3-4 weeks of symptoms. States all totally that she is not sleeping well. Was seen by her physician and placed on Zoloft, and Klonopin. She is hesitant to say so, but it sounds like she was given a working diagnosis of depression.  She took the Zoloft for one or 2 days and felt like it made her too sleepy during the day so she quit it.  She describes intermittent dizziness. Intermittent anxiety. No frank panic attacks. She's had a poor appetite. States she feels like she has "lost weight because of not eating". She denies being suicidal.  Past Medical History  Diagnosis Date  . Bronchitis     reports hx of frequent  . Fibroid   . Depression     pt states she does not have history of depresssion 02-18-2014  . Hypertension   . GERD (gastroesophageal reflux disease)    Past Surgical History  Procedure Laterality Date  . Abdominal hysterectomy    . Carpal tunnel release      bilateral  . Induced abortion      X2   Family History  Problem Relation Age of Onset  . Stroke Mother    History  Substance Use Topics  . Smoking status: Never Smoker   . Smokeless tobacco: Not on file  . Alcohol Use: No   OB History    Gravida Para Term Preterm AB TAB SAB Ectopic Multiple Living   3 1 1  0 2 2 0 0 0 1     Review of Systems  Constitutional: Positive for activity change, appetite change and fatigue. Negative for fever, chills and diaphoresis.  HENT: Negative for mouth sores, sore throat and trouble swallowing.   Eyes: Negative for visual disturbance.  Respiratory: Negative for cough, chest tightness, shortness of breath and wheezing.   Cardiovascular: Negative for chest pain.  Gastrointestinal:  Negative for nausea, vomiting, abdominal pain, diarrhea and abdominal distention.  Endocrine: Negative for polydipsia, polyphagia and polyuria.  Genitourinary: Negative for dysuria, frequency and hematuria.  Musculoskeletal: Negative for gait problem.  Skin: Negative for color change, pallor and rash.  Neurological: Positive for dizziness and headaches. Negative for syncope and light-headedness.  Hematological: Does not bruise/bleed easily.  Psychiatric/Behavioral: Positive for sleep disturbance and dysphoric mood. Negative for behavioral problems and confusion. The patient is nervous/anxious.       Allergies  Amlodipine; Hctz; Lisinopril; Tramadol; and Neurontin  Home Medications   Prior to Admission medications   Medication Sig Start Date End Date Taking? Authorizing Provider  albuterol (PROVENTIL HFA;VENTOLIN HFA) 108 (90 BASE) MCG/ACT inhaler Inhale 2 puffs into the lungs every 4 (four) hours as needed for wheezing or shortness of breath. 02/18/14   Orpah Greek, MD  albuterol (VENTOLIN HFA) 108 (90 BASE) MCG/ACT inhaler Inhale 2 puffs into the lungs every 6 (six) hours as needed for wheezing.    Historical Provider, MD  benzonatate (TESSALON) 100 MG capsule Take 1 capsule (100 mg total) by mouth every 8 (eight) hours. 02/18/14   Orpah Greek, MD  Cholecalciferol (VITAMIN D-3 PO) Take 1 tablet by mouth 2 (two) times daily.      Historical Provider, MD  cloNIDine (CATAPRES) 0.1 MG tablet Take 0.1 mg by mouth at bedtime.    Historical Provider, MD  fluticasone (FLONASE) 50 MCG/ACT nasal spray Place 1 spray into both nostrils daily. 02/18/14   Orpah Greek, MD  NIFEDIPINE PO Take by mouth.    Historical Provider, MD  predniSONE (DELTASONE) 20 MG tablet Take 2 tablets (40 mg total) by mouth daily with breakfast. 02/18/14   Orpah Greek, MD  SPIRONOLACTONE PO Take by mouth.    Historical Provider, MD   BP 168/75 mmHg  Pulse 73  Temp(Src) 97.8 F (36.6 C)  (Oral)  Resp 18  Ht 6' (1.829 m)  Wt 215 lb (97.523 kg)  BMI 29.15 kg/m2  SpO2 100% Physical Exam  Constitutional: She is oriented to person, place, and time. She appears well-developed and well-nourished. No distress.  Appears sad. She has tears on her face as I enter the room.  HENT:  Head: Normocephalic.  Eyes: Conjunctivae are normal. Pupils are equal, round, and reactive to light. No scleral icterus.  Neck: Normal range of motion. Neck supple. No thyromegaly present.  Cardiovascular: Normal rate and regular rhythm.  Exam reveals no gallop and no friction rub.   No murmur heard. Pulmonary/Chest: Effort normal and breath sounds normal. No respiratory distress. She has no wheezes. She has no rales.  Abdominal: Soft. Bowel sounds are normal. She exhibits no distension. There is no tenderness. There is no rebound.  Musculoskeletal: Normal range of motion.  Neurological: She is alert and oriented to person, place, and time.  Skin: Skin is warm and dry. No rash noted.  Psychiatric: She has a normal mood and affect. Her behavior is normal.    ED Course  Procedures (including critical care time) Labs Review Labs Reviewed  CBC WITH DIFFERENTIAL/PLATELET - Abnormal; Notable for the following:    RBC 3.70 (*)    Hemoglobin 11.0 (*)    HCT 33.3 (*)    All other components within normal limits  COMPREHENSIVE METABOLIC PANEL - Abnormal; Notable for the following:    Potassium 3.3 (*)    Glucose, Bld 137 (*)    Calcium 8.6 (*)    Anion gap 3 (*)    All other components within normal limits  URINALYSIS, ROUTINE W REFLEX MICROSCOPIC (NOT AT [facility]) - Abnormal; Notable for the following:    Specific Gravity, Urine 1.004 (*)    All other components within normal limits    Imaging Review No results found.   EKG Interpretation None      MDM   Final diagnoses:  Depression      Recommended she restart her Zoloft. Take a dose at night if she is finding that is making her  somnolent. Stop Klonopin and she feels it is making her somnolent and less she has significant anxiety or panic. Recheck with her primary care physician to discuss her difficulties with medications, and with her difficulty with acceptance of the diagnosis at this point.  Tanna Furry, MD 02/10/15 [phone]

## 2015-02-10 NOTE — ED Notes (Signed)
D/c home- follow-up care reviewed with pt

## 2015-05-31 ENCOUNTER — Emergency Department (HOSPITAL_BASED_OUTPATIENT_CLINIC_OR_DEPARTMENT_OTHER): Payer: 59

## 2015-05-31 ENCOUNTER — Emergency Department (HOSPITAL_BASED_OUTPATIENT_CLINIC_OR_DEPARTMENT_OTHER)
Admission: EM | Admit: 2015-05-31 | Discharge: 2015-05-31 | Disposition: A | Discharge status: Home / Self Care | Payer: 59 | Attending: Physician Assistant | Admitting: Physician Assistant

## 2015-05-31 ENCOUNTER — Encounter (HOSPITAL_BASED_OUTPATIENT_CLINIC_OR_DEPARTMENT_OTHER): Payer: Self-pay | Admitting: *Deleted

## 2015-05-31 DIAGNOSIS — Z86018 Personal history of other benign neoplasm: Secondary | ICD-10-CM | POA: Insufficient documentation

## 2015-05-31 DIAGNOSIS — R079 Chest pain, unspecified: Secondary | ICD-10-CM | POA: Diagnosis not present

## 2015-05-31 DIAGNOSIS — F329 Major depressive disorder, single episode, unspecified: Secondary | ICD-10-CM | POA: Insufficient documentation

## 2015-05-31 DIAGNOSIS — R0602 Shortness of breath: Secondary | ICD-10-CM | POA: Diagnosis not present

## 2015-05-31 DIAGNOSIS — Z7951 Long term (current) use of inhaled steroids: Secondary | ICD-10-CM | POA: Diagnosis not present

## 2015-05-31 DIAGNOSIS — Z79899 Other long term (current) drug therapy: Secondary | ICD-10-CM | POA: Insufficient documentation

## 2015-05-31 DIAGNOSIS — Z8709 Personal history of other diseases of the respiratory system: Secondary | ICD-10-CM | POA: Diagnosis not present

## 2015-05-31 DIAGNOSIS — I10 Essential (primary) hypertension: Secondary | ICD-10-CM | POA: Insufficient documentation

## 2015-05-31 DIAGNOSIS — F419 Anxiety disorder, unspecified: Secondary | ICD-10-CM | POA: Insufficient documentation

## 2015-05-31 DIAGNOSIS — Z8719 Personal history of other diseases of the digestive system: Secondary | ICD-10-CM | POA: Insufficient documentation

## 2015-05-31 LAB — COMPREHENSIVE METABOLIC PANEL
ALBUMIN: 4.1 g/dL (ref 3.5–5.0)
ALT: 18 U/L (ref 14–54)
ANION GAP: 5 (ref 5–15)
AST: 19 U/L (ref 15–41)
Alkaline Phosphatase: 89 U/L (ref 38–126)
BUN: 15 mg/dL (ref 6–20)
CHLORIDE: 107 mmol/L (ref 101–111)
CO2: 27 mmol/L (ref 22–32)
Calcium: 9.1 mg/dL (ref 8.9–10.3)
Creatinine, Ser: 1.04 mg/dL — ABNORMAL HIGH (ref 0.44–1.00)
GFR calc non Af Amer: >60 mL/min (ref >60)
GLUCOSE: 128 mg/dL — AB (ref 65–99)
POTASSIUM: 3.9 mmol/L (ref 3.5–5.1)
SODIUM: 139 mmol/L (ref 135–145)
Total Bilirubin: 0.3 mg/dL (ref 0.3–1.2)
Total Protein: 7.6 g/dL (ref 6.5–8.1)

## 2015-05-31 LAB — CBC WITH DIFFERENTIAL/PLATELET
Basophils Absolute: 0 10*3/uL (ref 0.0–0.1)
Basophils Relative: 1 %
Eosinophils Absolute: 0.2 10*3/uL (ref 0.0–0.7)
Eosinophils Relative: 2 %
HEMATOCRIT: 35.3 % — AB (ref 36.0–46.0)
HEMOGLOBIN: 11.7 g/dL — AB (ref 12.0–15.0)
LYMPHS ABS: 3.5 10*3/uL (ref 0.7–4.0)
Lymphocytes Relative: 42 %
MCH: 29.5 pg (ref 26.0–34.0)
MCHC: 33.1 g/dL (ref 30.0–36.0)
MCV: 89.1 fL (ref 78.0–100.0)
MONOS PCT: 5 %
Monocytes Absolute: 0.4 10*3/uL (ref 0.1–1.0)
NEUTROS ABS: 4.3 10*3/uL (ref 1.7–7.7)
NEUTROS PCT: 50 %
Platelets: 218 10*3/uL (ref 150–400)
RBC: 3.96 MIL/uL (ref 3.87–5.11)
RDW: 12.9 % (ref 11.5–15.5)
WBC: 8.3 10*3/uL (ref 4.0–10.5)

## 2015-05-31 LAB — LIPASE, BLOOD: Lipase: 24 U/L (ref 22–51)

## 2015-05-31 LAB — TROPONIN I

## 2015-05-31 MED ORDER — ASPIRIN EC 325 MG PO TBEC
325.0000 mg | DELAYED_RELEASE_TABLET | Freq: Once | ORAL | Status: AC
  Administered 2015-05-31: 325 mg via ORAL
  Filled 2015-05-31: qty 1

## 2015-05-31 NOTE — Discharge Instructions (Signed)
Please return with any change in chest pain, follow up with the clinic as planned.   Chest Pain (Nonspecific) It is often hard to give a specific diagnosis for the cause of chest pain. There is always a chance that your pain could be related to something serious, such as a heart attack or a blood clot in the lungs. You need to follow up with your health care provider for further evaluation. CAUSES   Heartburn.  Pneumonia or bronchitis.  Anxiety or stress.  Inflammation around your heart (pericarditis) or lung (pleuritis or pleurisy).  A blood clot in the lung.  A collapsed lung (pneumothorax). It can develop suddenly on its own (spontaneous pneumothorax) or from trauma to the chest.  Shingles infection (herpes zoster virus). The chest wall is composed of bones, muscles, and cartilage. Any of these can be the source of the pain.  The bones can be bruised by injury.  The muscles or cartilage can be strained by coughing or overwork.  The cartilage can be affected by inflammation and become sore (costochondritis). DIAGNOSIS  Lab tests or other studies may be needed to find the cause of your pain. Your health care provider may have you take a test called an ambulatory electrocardiogram (ECG). An ECG records your heartbeat patterns over a 24-hour period. You may also have other tests, such as:  Transthoracic echocardiogram (TTE). During echocardiography, sound waves are used to evaluate how blood flows through your heart.  Transesophageal echocardiogram (TEE).  Cardiac monitoring. This allows your health care provider to monitor your heart rate and rhythm in real time.  Holter monitor. This is a portable device that records your heartbeat and can help diagnose heart arrhythmias. It allows your health care provider to track your heart activity for several days, if needed.  Stress tests by exercise or by giving medicine that makes the heart beat faster. TREATMENT   Treatment depends  on what may be causing your chest pain. Treatment may include:  Acid blockers for heartburn.  Anti-inflammatory medicine.  Pain medicine for inflammatory conditions.  Antibiotics if an infection is present.  You may be advised to change lifestyle habits. This includes stopping smoking and avoiding alcohol, caffeine, and chocolate.  You may be advised to keep your head raised (elevated) when sleeping. This reduces the chance of acid going backward from your stomach into your esophagus. Most of the time, nonspecific chest pain will improve within 2-3 days with rest and mild pain medicine.  HOME CARE INSTRUCTIONS   If antibiotics were prescribed, take them as directed. Finish them even if you start to feel better.  For the next few days, avoid physical activities that bring on chest pain. Continue physical activities as directed.  Do not use any tobacco products, including cigarettes, chewing tobacco, or electronic cigarettes.  Avoid drinking alcohol.  Only take medicine as directed by your health care provider.  Follow your health care provider's suggestions for further testing if your chest pain does not go away.  Keep any follow-up appointments you made. If you do not go to an appointment, you could develop lasting (chronic) problems with pain. If there is any problem keeping an appointment, call to reschedule. SEEK MEDICAL CARE IF:   Your chest pain does not go away, even after treatment.  You have a rash with blisters on your chest.  You have a fever. SEEK IMMEDIATE MEDICAL CARE IF:   You have increased chest pain or pain that spreads to your arm, neck, jaw, back,  or abdomen.  You have shortness of breath.  You have an increasing cough, or you cough up blood.  You have severe back or abdominal pain.  You feel nauseous or vomit.  You have severe weakness.  You faint.  You have chills. This is an emergency. Do not wait to see if the pain will go away. Get medical  help at once. Call your local emergency services (911 in [place] Do not drive yourself to the hospital. MAKE SURE YOU:   Understand these instructions.  Will watch your condition.  Will get help right away if you are not doing well or get worse. Document Released: 05/31/2005 Document Revised: 08/26/2013 Document Reviewed: 03/26/2008 [facility] Patient Information 2015 [place], [place]. This information is not intended to replace advice given to you by your health care provider. Make sure you discuss any questions you have with your health care provider.

## 2015-05-31 NOTE — ED Provider Notes (Signed)
CSN: [id number]   Arrival date & time 05/31/15 1354  History  This chart was scribed for Courteney Julio Alm, MD by Altamease Oiler, ED Scribe. This patient was seen in room MH08/MH08 and the patient's care was started at 3:21 PM.  Chief Complaint  Patient presents with  . Chest Pain    HPI The history is provided by the patient. No language interpreter was used.   Denise Mejia is a 54 y.o. female with PMHx of HTN, GERD, and bronchitis who presents to the Emergency Department complaining of intermittent left-sided chest pain with onset 3 days ago. The pain worsened today while getting dressed and began to radiate down the chest. She describes the pain as tightness and rates it 9/10 in severity. Loosening her bra improves the pain. Associated symptoms include 1 day of SOB. She took her antihypertensive (Nifedipine) today but notes that she feels dizzy and anxious after taking it. She has spoken to her PCP about this issue without resolution.  No history of cardiac disease or high chortosterol. Pt denies tobacco use.   Past Medical History  Diagnosis Date  . Bronchitis     reports hx of frequent  . Fibroid   . Depression     pt states she does not have history of depresssion 02-18-2014  . Hypertension   . GERD (gastroesophageal reflux disease)     Past Surgical History  Procedure Laterality Date  . Abdominal hysterectomy    . Carpal tunnel release      bilateral  . Induced abortion      X2    Family History  Problem Relation Age of Onset  . Stroke Mother     Social History  Substance Use Topics  . Smoking status: Never Smoker   . Smokeless tobacco: None  . Alcohol Use: No     Review of Systems  Constitutional: Negative for fever and chills.  Respiratory: Positive for shortness of breath.   Cardiovascular: Positive for chest pain.  Gastrointestinal: Negative for nausea and vomiting.  Neurological: Negative for weakness.  All other systems reviewed and are  negative.  Home Medications   Prior to Admission medications   Medication Sig Start Date End Date Taking? Authorizing Provider  albuterol (PROVENTIL HFA;VENTOLIN HFA) 108 (90 BASE) MCG/ACT inhaler Inhale 2 puffs into the lungs every 4 (four) hours as needed for wheezing or shortness of breath. 02/18/14   Orpah Greek, MD  albuterol (VENTOLIN HFA) 108 (90 BASE) MCG/ACT inhaler Inhale 2 puffs into the lungs every 6 (six) hours as needed for wheezing.    Historical Provider, MD  benzonatate (TESSALON) 100 MG capsule Take 1 capsule (100 mg total) by mouth every 8 (eight) hours. 02/18/14   Orpah Greek, MD  Cholecalciferol (VITAMIN D-3 PO) Take 1 tablet by mouth 2 (two) times daily.      Historical Provider, MD  cloNIDine (CATAPRES) 0.1 MG tablet Take 0.1 mg by mouth at bedtime.    Historical Provider, MD  fluticasone (FLONASE) 50 MCG/ACT nasal spray Place 1 spray into both nostrils daily. 02/18/14   Orpah Greek, MD  NIFEDIPINE PO Take by mouth.    Historical Provider, MD  predniSONE (DELTASONE) 20 MG tablet Take 2 tablets (40 mg total) by mouth daily with breakfast. 02/18/14   Orpah Greek, MD  SPIRONOLACTONE PO Take by mouth.    Historical Provider, MD    Allergies  Amlodipine; Hctz; Lisinopril; Tramadol; and Neurontin  Triage Vitals: BP 140/59 mmHg  Pulse  91  Temp(Src) 98.4 F (36.9 C) (Oral)  Resp 20  Ht 6\' 1"  (1.854 m)  Wt 215 lb (97.523 kg)  BMI 28.37 kg/m2  SpO2 100%  Physical Exam  Constitutional: She is oriented to person, place, and time. She appears well-developed and well-nourished. No distress.  HENT:  Head: Normocephalic and atraumatic.  Eyes: EOM are normal.  Neck: Normal range of motion.  Cardiovascular: Normal rate, regular rhythm and normal heart sounds.  Exam reveals no gallop and no friction rub.   No murmur heard. Pulses:      Radial pulses are 2+ on the right side, and 2+ on the left side.  Pulmonary/Chest: Effort normal and  breath sounds normal.  CTAB  Abdominal: Soft. She exhibits no distension. There is no tenderness.  Musculoskeletal: Normal range of motion. She exhibits no edema.  Neurological: She is alert and oriented to person, place, and time.  Skin: Skin is warm and dry. No rash noted.  Psychiatric: She has a normal mood and affect. Judgment normal.  Nursing note and vitals reviewed.   ED Course  Procedures   DIAGNOSTIC STUDIES: Oxygen Saturation is 100% on RA, normal by my interpretation.    COORDINATION OF CARE: 3:23 PM Discussed treatment plan which includes CXR, EKG, and labs with pt at bedside and pt agreed to plan.  Labs Reviewed - No data to display  Imaging Review Dg Chest 2 View  05/31/2015   CLINICAL DATA:  Left chest pain for 3 days.  Shortness of breath.  EXAM: CHEST  2 VIEW  COMPARISON:  7/26/extend  FINDINGS: Mild cardiomegaly, cardiothoracic index 59%. No edema. No pleural effusion. The lungs appear clear.  IMPRESSION: 1. Mild enlargement of the cardiopericardial silhouette, otherwise negative.   Electronically Signed   By: Van Clines M.D.   On: 05/31/2015 15:55    EKG Interpretation  Date/Time:    Ventricular Rate:    PR Interval:    QRS Duration:   QT Interval:    QTC Calculation:   R Axis:     Text Interpretation:     EKG Interpretation  Date/Time:    Ventricular Rate:    PR Interval:    QRS Duration:   QT Interval:    QTC Calculation:   R Axis:     Text Interpretation:        In MUSE. Recordered, won't trasnfer EKG shows no acute ischemia, NSR, no   MDM   Final diagnoses:  None   Patient is a 54 year old female with history of hypertension presenting today with chest pain for the last couple days. She reports that it is been mildly worse this morning when she got dressed. She felt like she had chest pain that radiated to her left shoulder little bit. It is now almost completely  resolved. Patient has no history of hypercholesterol, or  smoking.  Given the length of time that going on, 3 days, I think one single troponin will suffice in order to rule out myocardial infarction. This also very atypical pain.  She will follow-up with her clinic if initial EKG and troponin are negative.  I personally performed the services described in this documentation, which was scribed in my presence. The recorded information has been reviewed and is accurate.    Courteney Julio Alm, MD 05/31/15 2314

## 2015-05-31 NOTE — ED Notes (Addendum)
Heaviness in her left chest x 2 days. States taking her bra off makes the pain go away. States she went to [facility] but the wait was too long.

## 2015-11-24 DIAGNOSIS — F321 Major depressive disorder, single episode, moderate: Secondary | ICD-10-CM

## 2015-11-24 HISTORY — DX: Major depressive disorder, single episode, moderate: F32.1

## 2015-12-15 IMAGING — DX DG CHEST 2V
2 series · 2 of 2 positions shown · non-contrast
Comparison: [DATE]/extend

CLINICAL DATA: Left chest pain for 3 days.  Shortness of breath.

EXAM:
CHEST  2 VIEW

[chest pa]
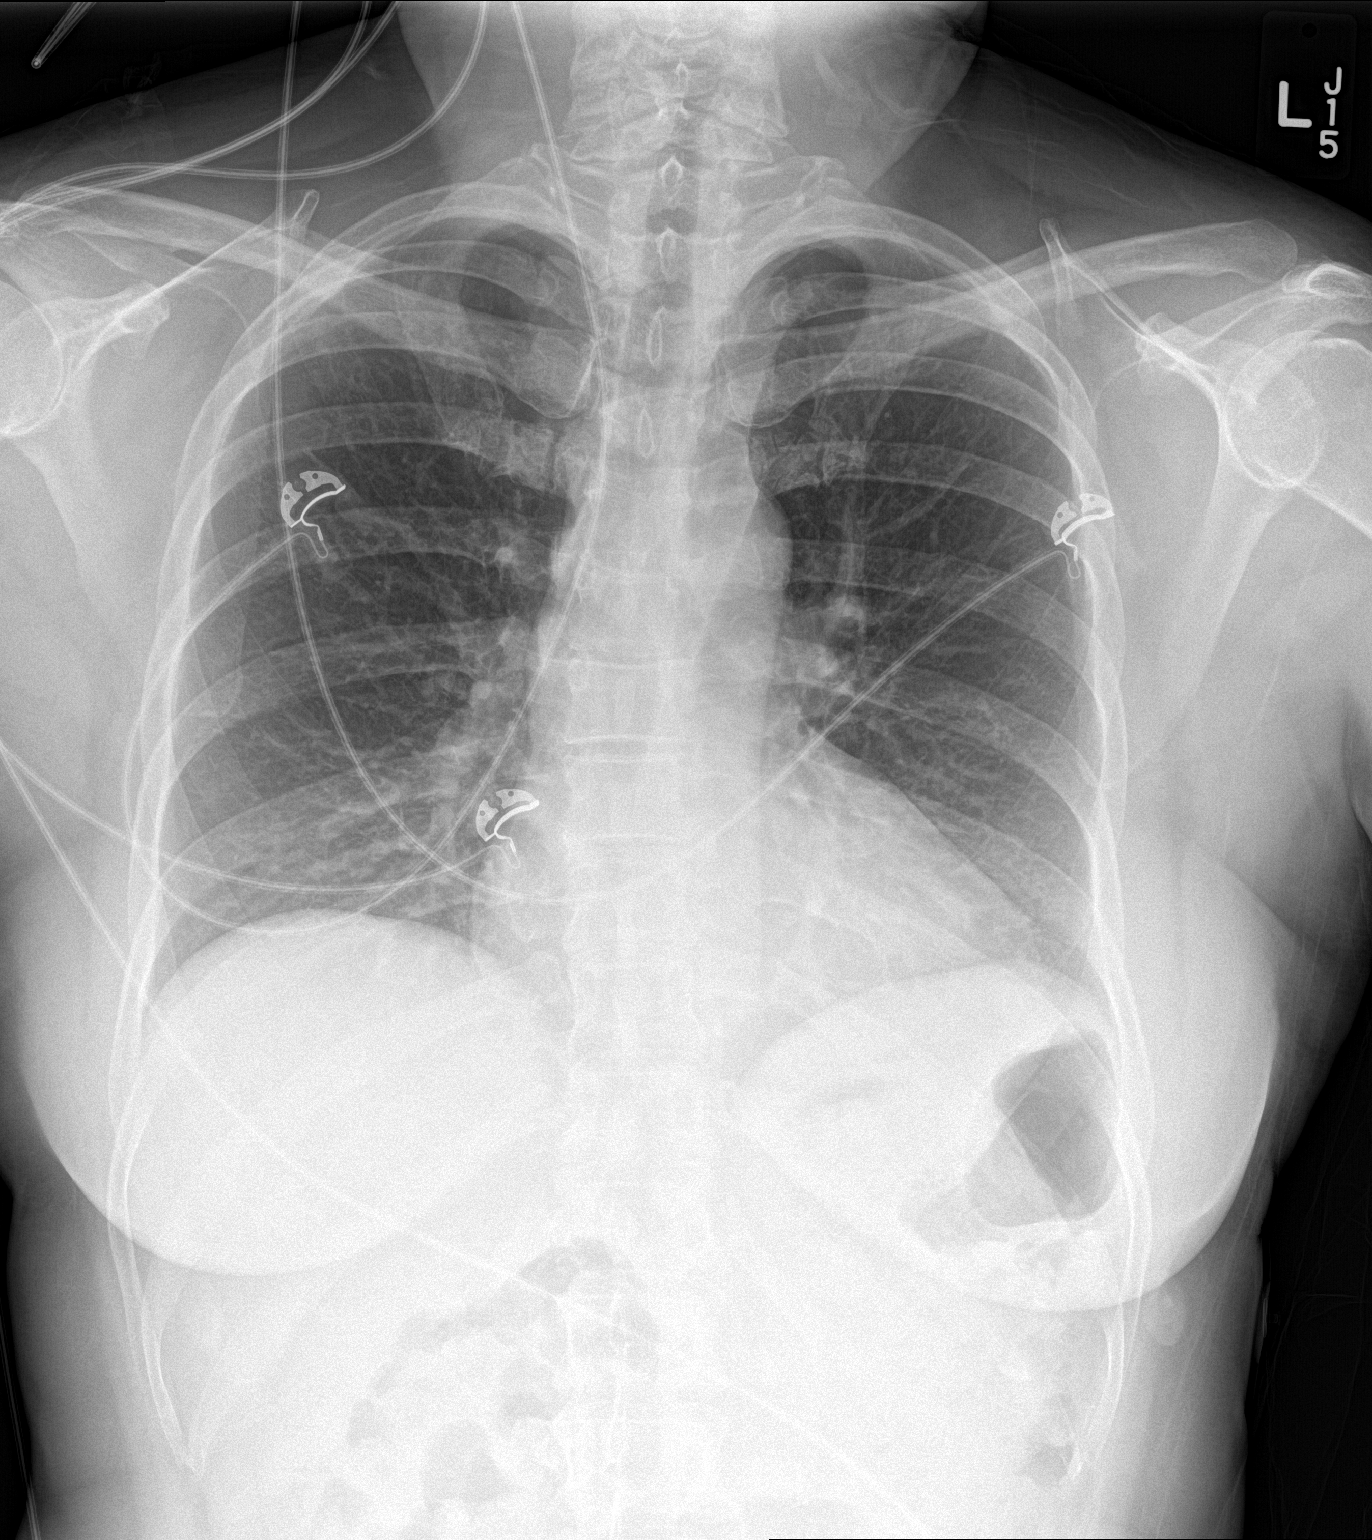

[chest lat]
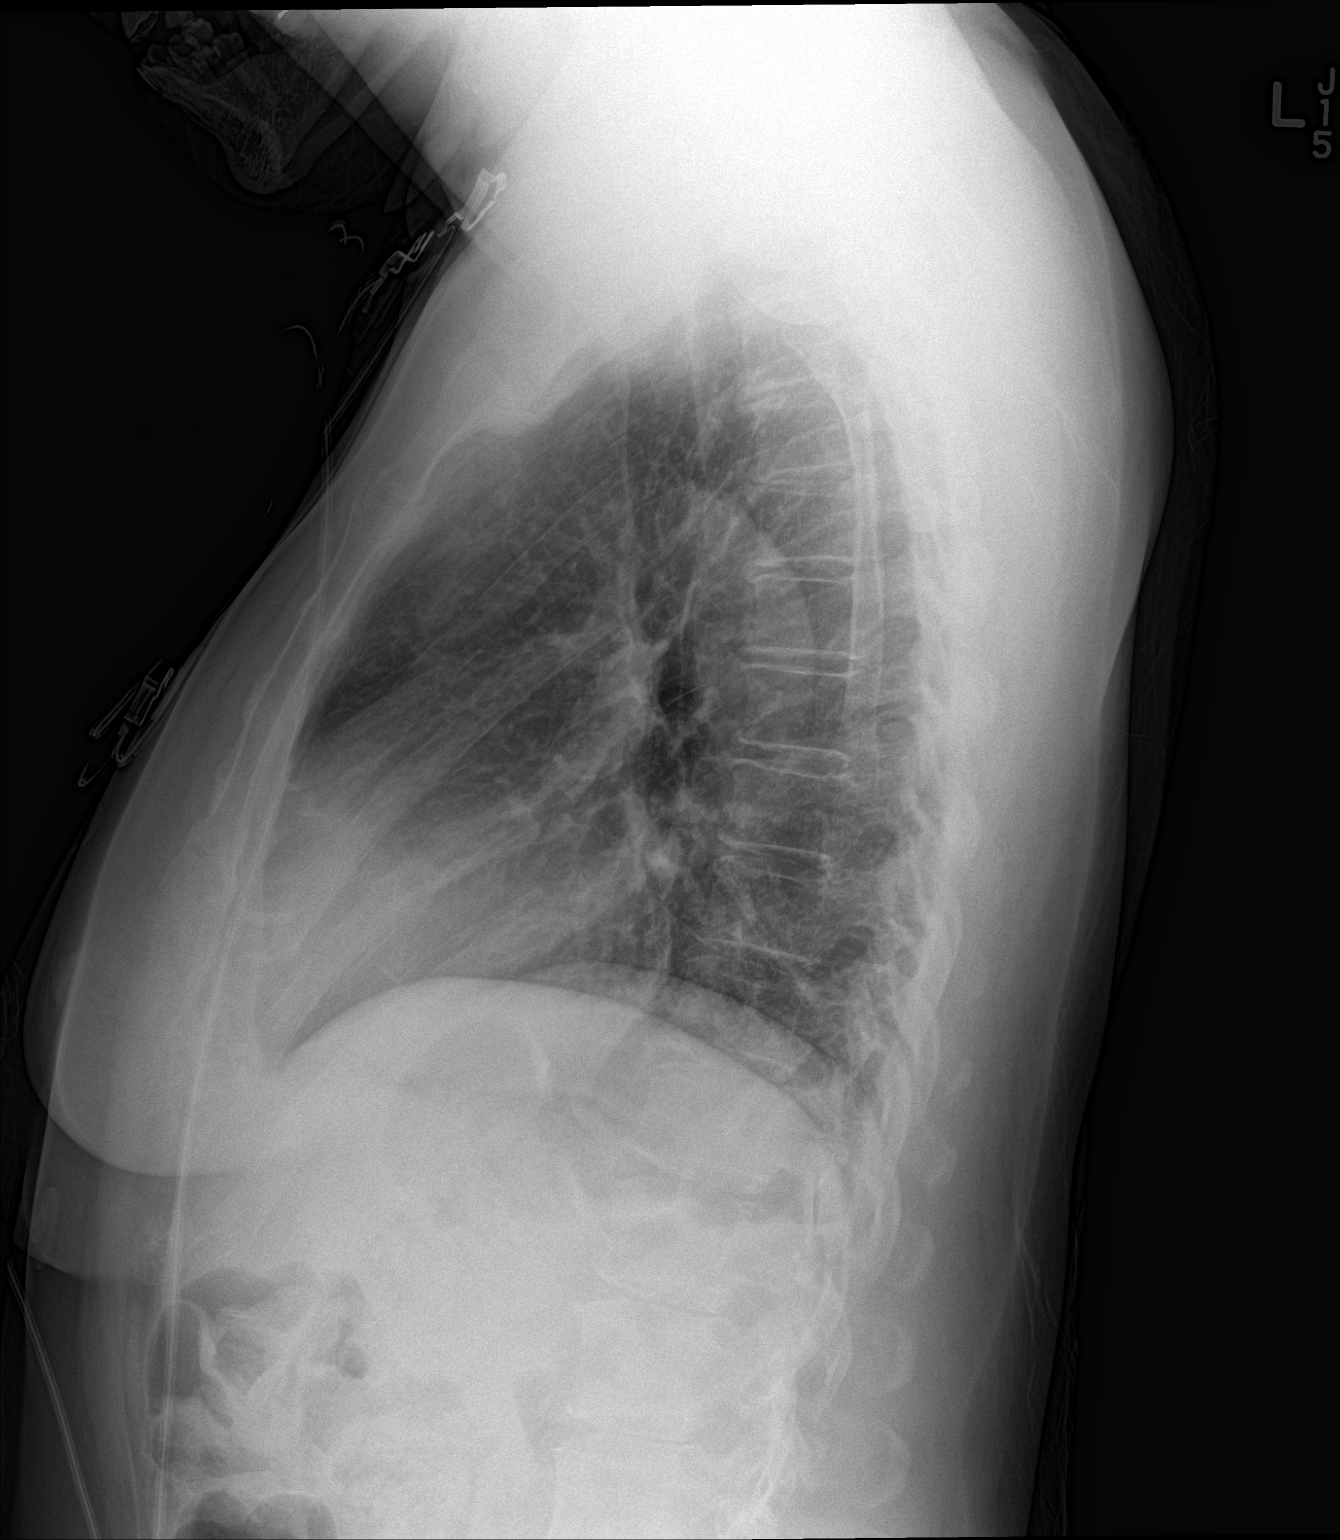

[2 of 2 positions shown; findings below may reference images not displayed]

FINDINGS: Mild cardiomegaly, cardiothoracic index 59%. No edema. No pleural
effusion. The lungs appear clear.
IMPRESSION: 1. Mild enlargement of the cardiopericardial silhouette, otherwise
negative.

## 2016-03-08 ENCOUNTER — Encounter (HOSPITAL_BASED_OUTPATIENT_CLINIC_OR_DEPARTMENT_OTHER): Payer: Self-pay | Admitting: *Deleted

## 2016-03-08 ENCOUNTER — Emergency Department (HOSPITAL_BASED_OUTPATIENT_CLINIC_OR_DEPARTMENT_OTHER)
Admission: EM | Admit: 2016-03-08 | Discharge: 2016-03-08 | Disposition: A | Discharge status: Home / Self Care | Payer: BLUE CROSS/BLUE SHIELD | Attending: Emergency Medicine | Admitting: Emergency Medicine

## 2016-03-08 DIAGNOSIS — I10 Essential (primary) hypertension: Secondary | ICD-10-CM | POA: Diagnosis not present

## 2016-03-08 DIAGNOSIS — Z79899 Other long term (current) drug therapy: Secondary | ICD-10-CM | POA: Diagnosis not present

## 2016-03-08 DIAGNOSIS — M7989 Other specified soft tissue disorders: Secondary | ICD-10-CM | POA: Diagnosis present

## 2016-03-08 DIAGNOSIS — R600 Localized edema: Secondary | ICD-10-CM | POA: Diagnosis not present

## 2016-03-08 DIAGNOSIS — F329 Major depressive disorder, single episode, unspecified: Secondary | ICD-10-CM | POA: Insufficient documentation

## 2016-03-08 LAB — CBC WITH DIFFERENTIAL/PLATELET
Basophils Absolute: 0.1 10*3/uL (ref 0.0–0.1)
Basophils Relative: 1 %
EOS ABS: 0.1 10*3/uL (ref 0.0–0.7)
EOS PCT: 2 %
HCT: 32.4 % — ABNORMAL LOW (ref 36.0–46.0)
HEMOGLOBIN: 10.9 g/dL — AB (ref 12.0–15.0)
LYMPHS ABS: 3.2 10*3/uL (ref 0.7–4.0)
LYMPHS PCT: 39 %
MCH: 30.1 pg (ref 26.0–34.0)
MCHC: 33.6 g/dL (ref 30.0–36.0)
MCV: 89.5 fL (ref 78.0–100.0)
Monocytes Absolute: 0.6 10*3/uL (ref 0.1–1.0)
Monocytes Relative: 7 %
NEUTROS PCT: 51 %
Neutro Abs: 4.1 10*3/uL (ref 1.7–7.7)
PLATELETS: 244 10*3/uL (ref 150–400)
RBC: 3.62 MIL/uL — ABNORMAL LOW (ref 3.87–5.11)
RDW: 12.8 % (ref 11.5–15.5)
WBC: 8 10*3/uL (ref 4.0–10.5)

## 2016-03-08 LAB — URINALYSIS, ROUTINE W REFLEX MICROSCOPIC
BILIRUBIN URINE: NEGATIVE
GLUCOSE, UA: NEGATIVE mg/dL
HGB URINE DIPSTICK: NEGATIVE
KETONES UR: NEGATIVE mg/dL
Leukocytes, UA: NEGATIVE
Nitrite: NEGATIVE
PH: 6.5 (ref 5.0–8.0)
Protein, ur: NEGATIVE mg/dL
SPECIFIC GRAVITY, URINE: 1.003 — AB (ref 1.005–1.030)

## 2016-03-08 LAB — COMPREHENSIVE METABOLIC PANEL
ALK PHOS: 97 U/L (ref 38–126)
ALT: 18 U/L (ref 14–54)
AST: 19 U/L (ref 15–41)
Albumin: 4 g/dL (ref 3.5–5.0)
Anion gap: 7 (ref 5–15)
BUN: 19 mg/dL (ref 6–20)
CALCIUM: 9.4 mg/dL (ref 8.9–10.3)
CHLORIDE: 106 mmol/L (ref 101–111)
CO2: 25 mmol/L (ref 22–32)
CREATININE: 1.13 mg/dL — AB (ref 0.44–1.00)
GFR calc non Af Amer: 55 mL/min — ABNORMAL LOW (ref >60)
Glucose, Bld: 112 mg/dL — ABNORMAL HIGH (ref 65–99)
Potassium: 3.8 mmol/L (ref 3.5–5.1)
SODIUM: 138 mmol/L (ref 135–145)
Total Bilirubin: 0.3 mg/dL (ref 0.3–1.2)
Total Protein: 7.5 g/dL (ref 6.5–8.1)

## 2016-03-08 MED ORDER — FUROSEMIDE 40 MG PO TABS
40.0000 mg | ORAL_TABLET | Freq: Once | ORAL | Status: AC
  Administered 2016-03-08: 40 mg via ORAL
  Filled 2016-03-08: qty 1

## 2016-03-08 MED ORDER — FUROSEMIDE 20 MG PO TABS: 20.0000 mg | ORAL_TABLET | Freq: Every day | ORAL | Status: DC

## 2016-03-08 NOTE — ED Notes (Signed)
Pt c/o bil leg swelling x 2 days, denies SOB

## 2016-03-08 NOTE — ED Provider Notes (Signed)
CSN: [id number]     Arrival date & time 03/08/16  1826 History  By signing my name below, I, Soijett Blue, attest that this documentation has been prepared under the direction and in the presence of Isla Pence, MD. Electronically Signed: Soijett Blue, ED Scribe. 03/08/2016. 6:51 PM.   Chief Complaint  Patient presents with  . Leg Swelling     The history is provided by the patient. No language interpreter was used.    Denise Mejia is a 55 y.o. female with a medical hx of HTN who presents to the Emergency Department complaining of bilateral leg swelling  onset  2 days. Denies any recent diet change. Pt notes that she takes spironolactone, and it is not a new medication for her. She notes that she has not tried any medications for the relief of her symptoms. She denies abdominal pain, SOB, leg pain, gait problem, color change, wound, rash, and any other symptoms.    Past Medical History  Diagnosis Date  . Bronchitis     reports hx of frequent  . Fibroid   . Depression     pt states she does not have history of depresssion 02-18-2014  . Hypertension   . GERD (gastroesophageal reflux disease)    Past Surgical History  Procedure Laterality Date  . Abdominal hysterectomy    . Carpal tunnel release      bilateral  . Induced abortion      X2   Family History  Problem Relation Age of Onset  . Stroke Mother    Social History  Substance Use Topics  . Smoking status: Never Smoker   . Smokeless tobacco: None  . Alcohol Use: No   OB History    Gravida Para Term Preterm AB TAB SAB Ectopic Multiple Living   3 1 1  0 2 2 0 0 0 1     Review of Systems  Respiratory: Negative for shortness of breath.   Cardiovascular: Positive for leg swelling (BLE).  Musculoskeletal: Negative for arthralgias and gait problem.  Skin: Negative for color change, rash and wound.      Allergies  Amlodipine; Hctz; Lisinopril; Tramadol; and Neurontin  Home Medications   Prior to Admission  medications   Medication Sig Start Date End Date Taking? Authorizing Provider  FLUoxetine (PROZAC) 10 MG tablet Take 10 mg by mouth daily.   Yes Historical Provider, MD  gabapentin (NEURONTIN) 300 MG capsule Take 300 mg by mouth 3 (three) times daily.   Yes Historical Provider, MD  albuterol (PROVENTIL HFA;VENTOLIN HFA) 108 (90 BASE) MCG/ACT inhaler Inhale 2 puffs into the lungs every 4 (four) hours as needed for wheezing or shortness of breath. 02/18/14   Orpah Greek, MD  Cholecalciferol (VITAMIN D-3 PO) Take 1 tablet by mouth 2 (two) times daily.      Historical Provider, MD  furosemide (LASIX) 20 MG tablet Take 1 tablet (20 mg total) by mouth daily. 03/08/16   Isla Pence, MD  NIFEDIPINE PO Take by mouth.    Historical Provider, MD  SPIRONOLACTONE PO Take by mouth.    Historical Provider, MD   BP 152/74 mmHg  Pulse 84  Temp(Src) 98.1 F (36.7 C) (Oral)  Resp 18  Ht 6' (1.829 m)  Wt 260 lb (117.935 kg)  BMI 35.25 kg/m2  SpO2 100% Physical Exam  Constitutional: She is oriented to person, place, and time. She appears well-developed and well-nourished. No distress.  HENT:  Head: Normocephalic and atraumatic.  Eyes: EOM are normal.  Neck: Neck supple.  Cardiovascular: Normal rate, regular rhythm and normal heart sounds.  Exam reveals no gallop and no friction rub.   No murmur heard. Pulmonary/Chest: Effort normal and breath sounds normal. No respiratory distress. She has no wheezes. She has no rales.  Abdominal: Soft. She exhibits no distension. There is no tenderness.  Musculoskeletal: Normal range of motion. She exhibits edema.       Right lower leg: She exhibits edema.       Left lower leg: She exhibits edema.  1+ edema to bilateral LE  Neurological: She is alert and oriented to person, place, and time.  Skin: Skin is warm and dry.  Psychiatric: She has a normal mood and affect. Her behavior is normal.  Nursing note and vitals reviewed.   ED Course  Procedures  (including critical care time) DIAGNOSTIC STUDIES: Oxygen Saturation is 100% on RA, nl by my interpretation.    COORDINATION OF CARE: 6:49 PM Discussed treatment plan with pt at bedside which includes lasix, labs, UA, and pt agreed to plan.    Labs Review Labs Reviewed  CBC WITH DIFFERENTIAL/PLATELET - Abnormal; Notable for the following:    RBC 3.62 (*)    Hemoglobin 10.9 (*)    HCT 32.4 (*)    All other components within normal limits  COMPREHENSIVE METABOLIC PANEL - Abnormal; Notable for the following:    Glucose, Bld 112 (*)    Creatinine, Ser 1.13 (*)    GFR calc non Af Amer 55 (*)    All other components within normal limits  URINALYSIS, ROUTINE W REFLEX MICROSCOPIC (NOT AT [facility]) - Abnormal; Notable for the following:    Specific Gravity, Urine 1.003 (*)    All other components within normal limits    I have personally reviewed and evaluated these lab results as part of my medical decision-making.    MDM  Pt is feeling better.  I will add lasix for pt to take if legs swell.  Pt knows to return if worse. Final diagnoses:  Bilateral lower extremity edema  I personally performed the services described in this documentation, which was scribed in my presence. The recorded information has been reviewed and is accurate.      Isla Pence, MD 03/08/16 [phone]

## 2016-03-08 NOTE — Discharge Instructions (Signed)

## 2016-07-11 ENCOUNTER — Encounter (HOSPITAL_BASED_OUTPATIENT_CLINIC_OR_DEPARTMENT_OTHER): Payer: Self-pay | Admitting: *Deleted

## 2016-07-11 ENCOUNTER — Emergency Department (HOSPITAL_BASED_OUTPATIENT_CLINIC_OR_DEPARTMENT_OTHER)
Admission: EM | Admit: 2016-07-11 | Discharge: 2016-07-11 | Disposition: A | Discharge status: Home / Self Care | Payer: BLUE CROSS/BLUE SHIELD | Attending: Emergency Medicine | Admitting: Emergency Medicine

## 2016-07-11 DIAGNOSIS — B360 Pityriasis versicolor: Secondary | ICD-10-CM | POA: Insufficient documentation

## 2016-07-11 DIAGNOSIS — R21 Rash and other nonspecific skin eruption: Secondary | ICD-10-CM | POA: Diagnosis present

## 2016-07-11 DIAGNOSIS — Z79899 Other long term (current) drug therapy: Secondary | ICD-10-CM | POA: Diagnosis not present

## 2016-07-11 DIAGNOSIS — I10 Essential (primary) hypertension: Secondary | ICD-10-CM | POA: Insufficient documentation

## 2016-07-11 DIAGNOSIS — M654 Radial styloid tenosynovitis [de Quervain]: Secondary | ICD-10-CM

## 2016-07-11 MED ORDER — TERBINAFINE HCL 1 % EX CREA: 1.0000 "application " | TOPICAL_CREAM | Freq: Two times a day (BID) | CUTANEOUS | 0 refills | Status: DC

## 2016-07-11 MED ORDER — IBUPROFEN 600 MG PO TABS: 600.0000 mg | ORAL_TABLET | Freq: Four times a day (QID) | ORAL | 0 refills | Status: DC | PRN

## 2016-07-11 MED FILL — IBUPROFEN 600 MG TABLET: 600 | 8 days supply | Qty: 30 | Fill #0

## 2016-07-11 NOTE — ED Triage Notes (Signed)
Pt reports R wrist pain for almost 1 wk; denies fever, known injury; denies new activities and/or repetitive motions. Reports rash to bil underarms and across chest since Friday. Denies sob, exposure to new products.

## 2016-07-11 NOTE — ED Provider Notes (Signed)
[facility] Provider Note   CSN: [id number] Arrival date & time: 07/11/16  0846     History   Chief Complaint Chief Complaint  Patient presents with  . Wrist Pain  . Rash    HPI Denise Mejia is a 55 y.o. female.  HPI Denise Mejia is a 55 y.o. female with history of bronchitis, hypertension, depression, acid reflux, presents to emergency department complaining of right wrist pain and rash. Patient states that her wrist pain began one week ago. She denies any injuries. She states it is painful to move her wrist. 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 She denies history of similar pain. She states she does have carpal tunnel and had surgery several years ago and has not had any issues since then. She states this pain feels different. She denies any numbness or tingling to her fingers. Denies any other complaints regarding her wrist. Patient is also complaining of itchy rash across her chest and shoulders  And axilla bilaterally. She states rash started 3 days ago. She denies any new products. She denies any recent contacts with similar rash. States rash is itchy. No tx prior to coming in.   Past Medical History:  Diagnosis Date  . Bronchitis    reports hx of frequent  . Depression    pt states she does not have history of depresssion 02-18-2014  . Fibroid   . GERD (gastroesophageal reflux disease)   . Hypertension     Patient Active Problem List   Diagnosis Date Noted  . Essential hypertension 05/14/2013  . Murmur 05/14/2013  . Bronchitis   . Fibroid   . Depression     Past Surgical History:  Procedure Laterality Date  . ABDOMINAL HYSTERECTOMY    . CARPAL TUNNEL RELEASE     bilateral  . INDUCED ABORTION     X2    OB History    Gravida Para Term Preterm AB Living   3 1 1  0 2 1   SAB TAB Ectopic Multiple Live Births   0 2 0 0 1       Home Medications    Prior to Admission medications   Medication Sig Start Date End Date  Taking? Authorizing Provider  albuterol (PROVENTIL HFA;VENTOLIN HFA) 108 (90 BASE) MCG/ACT inhaler Inhale 2 puffs into the lungs every 4 (four) hours as needed for wheezing or shortness of breath. 02/18/14  Yes Orpah Greek, MD  Cholecalciferol (VITAMIN D-3 PO) Take 1 tablet by mouth 2 (two) times daily.     Yes Historical Provider, MD  gabapentin (NEURONTIN) 300 MG capsule Take 300 mg by mouth 3 (three) times daily.   Yes Historical Provider, MD  NIFEDIPINE PO Take by mouth.   Yes Historical Provider, MD  SPIRONOLACTONE PO Take by mouth.   Yes Historical Provider, MD  FLUoxetine (PROZAC) 10 MG tablet Take 10 mg by mouth daily.    Historical Provider, MD  furosemide (LASIX) 20 MG tablet Take 1 tablet (20 mg total) by mouth daily. Patient taking differently: Take 20 mg by mouth daily.  03/08/16   Isla Pence, MD  ibuprofen (ADVIL,MOTRIN) 600 MG tablet Take 1 tablet (600 mg total) by mouth every 6 (six) hours as needed. 07/11/16   Sharonlee Nine, PA-C  terbinafine (LAMISIL AT) 1 % cream Apply 1 application topically 2 (two) times daily. 07/11/16   Jeannett Senior, PA-C    Family History Family History  Problem Relation Age  of Onset  . Stroke Mother     Social History Social History  Substance Use Topics  . Smoking status: Never Smoker  . Smokeless tobacco: Never Used  . Alcohol use No     Allergies   Amlodipine; Hctz [hydrochlorothiazide]; Lisinopril; Tramadol; and Neurontin [gabapentin]   Review of Systems Review of Systems  Constitutional: Negative for chills and fever.  Respiratory: Negative for cough, chest tightness and shortness of breath.   Cardiovascular: Negative for chest pain, palpitations and leg swelling.  Gastrointestinal: Negative for abdominal pain, diarrhea, nausea and vomiting.  Genitourinary: Negative for dysuria, flank pain and pelvic pain.  Musculoskeletal: Positive for arthralgias. Negative for myalgias, neck pain and neck stiffness.  Skin:  Positive for rash.  Neurological: Negative for dizziness, weakness and headaches.  All other systems reviewed and are negative.    Physical Exam Updated Vital Signs BP 152/62 (BP Location: Right Arm)   Pulse 95   Temp 98.2 F (36.8 C) (Oral)   Resp 18   Ht 6' (1.829 m)   Wt 102.5 kg   SpO2 100%   BMI 30.65 kg/m   Physical Exam  Constitutional: She is oriented to person, place, and time. She appears well-developed and well-nourished. No distress.  HENT:  Head: Normocephalic.  Eyes: Conjunctivae are normal.  Neck: Neck supple.  Cardiovascular: Normal rate, regular rhythm and normal heart sounds.   Pulmonary/Chest: Effort normal and breath sounds normal. No respiratory distress. She has no wheezes. She has no rales.  Abdominal: Soft. Bowel sounds are normal. She exhibits no distension. There is no tenderness. There is no rebound.  Musculoskeletal: She exhibits no edema.  ttp over radial wrist. Pain with flexion and extension of the wrist. Positive finkelstein test. Distal radial pulse intact. Normal grip. Full rom of all figners. Full rom of the wrist with pain.   Neurological: She is alert and oriented to person, place, and time.  Skin: Skin is warm and dry.  Psychiatric: She has a normal mood and affect. Her behavior is normal.  Hyperpigmented scaly rash to bilateral axilla, chest, neck, upper back. No vesicles, papules, drainage.   Nursing note and vitals reviewed.    ED Treatments / Results  Labs (all labs ordered are listed, but only abnormal results are displayed) Labs Reviewed - No data to display  EKG  EKG Interpretation None       Radiology No results found.  Procedures Procedures (including critical care time)  Medications Ordered in ED Medications - No data to display   Initial Impression / Assessment and Plan / ED Course  I have reviewed the triage vital signs and the nursing notes.  Pertinent labs & imaging results that were available during my  care of the patient were reviewed by me and considered in my medical decision making (see chart for details).  Clinical Course     Pt with scaly hyperpigmented rash to chest, neck, back, most consistent with tinea versicolor. Will treat with Lamisil and selsum blue shampoo. Wrist exam most consistent with tendinitis, question deQuervain tenosynovitis, positive filkensteins test. Placed in thumb spika. Home with nsaids, follow up with pcp.   Vitals:   07/11/16 0851  BP: 152/62  Pulse: 95  Resp: 18  Temp: 98.2 F (36.8 C)  TempSrc: Oral  SpO2: 100%  Weight: 102.5 kg  Height: 6' (1.829 m)    Final Clinical Impressions(s) / ED Diagnoses   Final diagnoses:  Tinea versicolor  De Quervain's tenosynovitis, right    New  Prescriptions New Prescriptions   IBUPROFEN (ADVIL,MOTRIN) 600 MG TABLET    Take 1 tablet (600 mg total) by mouth every 6 (six) hours as needed.   TERBINAFINE (LAMISIL AT) 1 % CREAM    Apply 1 application topically 2 (two) times daily.     Jeannett Senior, PA-C 07/11/16 [id number]    Leo Grosser, MD 07/11/16 [phone]

## 2016-07-11 NOTE — Discharge Instructions (Signed)
Wash your body with selsum blue or any other selenium sulfide shampoo. Apply lamisil cream twice a day. Wear splint for 1-2 weeks for your wrist pain. Ice and elevate. Follow up with primary care doctor or your hand surgeon for recheck. Ibuprofen for pain. No lifting greater than 5lb with right hand.

## 2016-10-19 ENCOUNTER — Encounter (HOSPITAL_BASED_OUTPATIENT_CLINIC_OR_DEPARTMENT_OTHER): Payer: Self-pay | Admitting: *Deleted

## 2016-10-19 ENCOUNTER — Emergency Department (HOSPITAL_BASED_OUTPATIENT_CLINIC_OR_DEPARTMENT_OTHER): Payer: BLUE CROSS/BLUE SHIELD

## 2016-10-19 ENCOUNTER — Emergency Department (HOSPITAL_BASED_OUTPATIENT_CLINIC_OR_DEPARTMENT_OTHER)
Admission: EM | Admit: 2016-10-19 | Discharge: 2016-10-19 | Disposition: A | Discharge status: Home / Self Care | Payer: BLUE CROSS/BLUE SHIELD | Attending: Emergency Medicine | Admitting: Emergency Medicine

## 2016-10-19 DIAGNOSIS — Z79899 Other long term (current) drug therapy: Secondary | ICD-10-CM | POA: Insufficient documentation

## 2016-10-19 DIAGNOSIS — Z791 Long term (current) use of non-steroidal anti-inflammatories (NSAID): Secondary | ICD-10-CM | POA: Diagnosis not present

## 2016-10-19 DIAGNOSIS — R079 Chest pain, unspecified: Secondary | ICD-10-CM | POA: Diagnosis present

## 2016-10-19 DIAGNOSIS — R0789 Other chest pain: Secondary | ICD-10-CM | POA: Diagnosis not present

## 2016-10-19 DIAGNOSIS — I10 Essential (primary) hypertension: Secondary | ICD-10-CM | POA: Insufficient documentation

## 2016-10-19 DIAGNOSIS — R002 Palpitations: Secondary | ICD-10-CM

## 2016-10-19 LAB — TROPONIN I: Troponin I: <0.03 ng/mL (ref <0.03)

## 2016-10-19 LAB — BASIC METABOLIC PANEL
Anion gap: 6 (ref 5–15)
BUN: 12 mg/dL (ref 6–20)
CALCIUM: 9.4 mg/dL (ref 8.9–10.3)
CO2: 26 mmol/L (ref 22–32)
CREATININE: 0.86 mg/dL (ref 0.44–1.00)
Chloride: 105 mmol/L (ref 101–111)
GFR calc Af Amer: >60 mL/min (ref >60)
GFR calc non Af Amer: >60 mL/min (ref >60)
Glucose, Bld: 104 mg/dL — ABNORMAL HIGH (ref 65–99)
Potassium: 4.5 mmol/L (ref 3.5–5.1)
Sodium: 137 mmol/L (ref 135–145)

## 2016-10-19 LAB — CBC
HCT: 35.6 % — ABNORMAL LOW (ref 36.0–46.0)
Hemoglobin: 12 g/dL (ref 12.0–15.0)
MCH: 30.4 pg (ref 26.0–34.0)
MCHC: 33.7 g/dL (ref 30.0–36.0)
MCV: 90.1 fL (ref 78.0–100.0)
PLATELETS: 224 10*3/uL (ref 150–400)
RBC: 3.95 MIL/uL (ref 3.87–5.11)
RDW: 12.5 % (ref 11.5–15.5)
WBC: 6 10*3/uL (ref 4.0–10.5)

## 2016-10-19 LAB — I-STAT TROPONIN, ED: TROPONIN I, POC: 0 ng/mL (ref 0.00–0.08)

## 2016-10-19 NOTE — ED Notes (Signed)
istat trop done due to lab machines are down

## 2016-10-19 NOTE — ED Notes (Signed)
Patient transported to X-ray 

## 2016-10-19 NOTE — ED Triage Notes (Signed)
Pt with left sided chest pain. Pt reports that she has had this pain for quite a while intermittently  (usually occurs 30 minutes after taking BP meds).  She reports pressure and palpitations in her left chest.  Pt states that she has an appointment to have a holter ekg placed for 2 days on Monday.  Pt states that she had left chest pain with palpitations, left arm pain and also some sob.  Pt states that the symptoms were no more severe today than they have been on other days.  Pt denies any palpitations or chest pain at this moment, she continues to have the pain in her left arm.

## 2016-10-19 NOTE — ED Provider Notes (Signed)
[facility] Provider Note   CSN: [id number] Arrival date & time: 10/19/16  1209     History   Chief Complaint Chief Complaint  Patient presents with  . Chest Pain    HPI Denise Mejia is a 56 y.o. female.  HPI patient presents with dull left-sided chest pain. Has gone on and off for a while. States she has episodes of where she feels her heart race. Usually happens around half an hour after she takes her medicines. Is seeing cardiology for it and is scheduled on Monday to have a Holter monitor and an echocardiogram. No previous history coronary artery disease. No fevers. States today she had the episode of the same pain but now it went a little down the left arm. States that is somewhat new. No lightheadedness or dizziness. States it feels that there is a tight muscle in her chest.  Past Medical History:  Diagnosis Date  . Bronchitis    reports hx of frequent  . Depression    pt states she does not have history of depresssion 02-18-2014  . Fibroid   . GERD (gastroesophageal reflux disease)   . Hypertension     Patient Active Problem List   Diagnosis Date Noted  . Essential hypertension 05/14/2013  . Murmur 05/14/2013  . Bronchitis   . Fibroid   . Depression     Past Surgical History:  Procedure Laterality Date  . ABDOMINAL HYSTERECTOMY    . CARPAL TUNNEL RELEASE     bilateral  . INDUCED ABORTION     X2    OB History    Gravida Para Term Preterm AB Living   3 1 1  0 2 1   SAB TAB Ectopic Multiple Live Births   0 2 0 0 1       Home Medications    Prior to Admission medications   Medication Sig Start Date End Date Taking? Authorizing Provider  albuterol (PROVENTIL HFA;VENTOLIN HFA) 108 (90 BASE) MCG/ACT inhaler Inhale 2 puffs into the lungs every 4 (four) hours as needed for wheezing or shortness of breath. 02/18/14   Orpah Greek, MD  Cholecalciferol (VITAMIN D-3 PO) Take 1 tablet by mouth 2 (two) times daily.      Historical Provider, MD    FLUoxetine (PROZAC) 10 MG tablet Take 10 mg by mouth daily.    Historical Provider, MD  furosemide (LASIX) 20 MG tablet Take 1 tablet (20 mg total) by mouth daily. Patient taking differently: Take 20 mg by mouth daily.  03/08/16   Isla Pence, MD  gabapentin (NEURONTIN) 300 MG capsule Take 300 mg by mouth 3 (three) times daily.    Historical Provider, MD  ibuprofen (ADVIL,MOTRIN) 600 MG tablet Take 1 tablet (600 mg total) by mouth every 6 (six) hours as needed. 07/11/16   Tatyana Kirichenko, PA-C  NIFEDIPINE PO Take by mouth.    Historical Provider, MD  SPIRONOLACTONE PO Take by mouth.    Historical Provider, MD  terbinafine (LAMISIL AT) 1 % cream Apply 1 application topically 2 (two) times daily. 07/11/16   Jeannett Senior, PA-C    Family History Family History  Problem Relation Age of Onset  . Stroke Mother     Social History Social History  Substance Use Topics  . Smoking status: Never Smoker  . Smokeless tobacco: Never Used  . Alcohol use No     Allergies   Amlodipine; Hctz [hydrochlorothiazide]; Lisinopril; and Tramadol   Review of Systems Review of Systems  Constitutional:  Negative for appetite change.  HENT: Negative for congestion.   Eyes: Negative for redness.  Respiratory: Positive for chest tightness.   Cardiovascular: Positive for chest pain. Negative for leg swelling.  Gastrointestinal: Negative for abdominal pain.  Genitourinary: Negative for dyspareunia and enuresis.  Musculoskeletal: Negative for back pain.  Neurological: Negative for syncope, speech difficulty and headaches.  Hematological: Negative for adenopathy.  Psychiatric/Behavioral: Negative for confusion.     Physical Exam Updated Vital Signs BP 131/78 (BP Location: Right Arm)   Pulse 63   Temp 98.6 F (37 C) (Oral)   Resp 17   Ht 6' (1.829 m)   Wt 226 lb (102.5 kg)   SpO2 99%   BMI 30.65 kg/m   Physical Exam  Constitutional: She appears well-developed.  HENT:  Head:  Atraumatic.  Eyes: EOM are normal.  Neck: Neck supple.  Cardiovascular: Normal rate.   Pulmonary/Chest: Effort normal. She exhibits tenderness.  Mild tenderness to left anterior chest wall.  Abdominal: Soft. There is no tenderness.  Musculoskeletal: Normal range of motion. She exhibits no edema.  Neurological: She is alert.  Skin: Skin is warm. Capillary refill takes less than 2 seconds.  Psychiatric: She has a normal mood and affect.     ED Treatments / Results  Labs (all labs ordered are listed, but only abnormal results are displayed) Labs Reviewed  BASIC METABOLIC PANEL - Abnormal; Notable for the following:       Result Value   Glucose, Bld 104 (*)    All other components within normal limits  CBC - Abnormal; Notable for the following:    HCT 35.6 (*)    All other components within normal limits  TROPONIN I  TROPONIN I    EKG  EKG Interpretation  Date/Time:  Thursday October 19 2016 12:29:42 EST Ventricular Rate:  66 PR Interval:    QRS Duration: 86 QT Interval:  407 QTC Calculation: 427 R Axis:   13 Text Interpretation:  Sinus rhythm Consider left ventricular hypertrophy Borderline T abnormalities, lateral leads Baseline wander in lead(s) V5 Confirmed by Alvino Chapel  MD, Braylen Denunzio [phone]) on 10/19/2016 12:39:08 PM       EKG Interpretation  Date/Time:  Thursday October 19 2016 14:22:22 EST Ventricular Rate:  65 PR Interval:    QRS Duration: 79 QT Interval:  417 QTC Calculation: 434 R Axis:   12 Text Interpretation:  Sinus rhythm Consider left ventricular hypertrophy Borderline T abnormalities, anterior leads no change from earlier today. Confirmed by Alvino Chapel  MD, Ovid Curd [phone]) on 10/19/2016 3:11:55 PM       Radiology Dg Chest 2 View  Result Date: 10/19/2016 CLINICAL DATA:  Left-sided chest pain and pressure for the past 2-3 weeks. Onset of left arm today. History of bronchitis, hypertension, nonsmoker. EXAM: CHEST  2 VIEW COMPARISON:  PA and lateral  chest x-ray of April 18, 2016 FINDINGS: The lungs are adequately inflated. There is no focal infiltrate. There is no pleural effusion. The heart is top-normal in size. The pulmonary vascularity is normal. The mediastinum is normal in width. The bony thorax is unremarkable. IMPRESSION: There is no pneumonia nor other acute cardiopulmonary abnormality. Electronically Signed   By: David  Martinique M.D.   On: 10/19/2016 12:52    Procedures Procedures (including critical care time)  Medications Ordered in ED Medications - No data to display   Initial Impression / Assessment and Plan / ED Course  I have reviewed the triage vital signs and the nursing notes.  Pertinent labs &  imaging results that were available during my care of the patient were reviewed by me and considered in my medical decision making (see chart for details).     Patient with chest pain. Has seen cardiology for the same. Has echocardiogram scheduled on Monday. EKG is stable here. Enzymes negative initially and will get a troponin. If negative discharge home and follow with cardiology.  Final Clinical Impressions(s) / ED Diagnoses   Final diagnoses:  Nonspecific chest pain    New Prescriptions New Prescriptions   No medications on file     Davonna Belling, MD 10/19/16 1512

## 2016-10-19 NOTE — Discharge Instructions (Signed)
Follow-up on Monday for the echocardiogram and Holter monitor.

## 2016-10-19 NOTE — ED Provider Notes (Signed)
Please see Dr. Neomia Dear note for history, physical, prior care and plan. Briefly this is a 56yo female with palpitations, CP, has cardiology appt Monday.  Plan for delta troponin, if negative may discharge for outpt follow up.  Troponin negative. Pt CP free.  Recommend Cardiology follow up, return for CP/dyspnea or other concerns.   Gareth Morgan, MD 10/20/16 [phone]

## 2016-11-09 DIAGNOSIS — M25541 Pain in joints of right hand: Secondary | ICD-10-CM | POA: Insufficient documentation

## 2016-11-09 HISTORY — DX: Pain in joints of right hand: M25.541

## 2017-03-22 DIAGNOSIS — G4719 Other hypersomnia: Secondary | ICD-10-CM

## 2017-03-22 HISTORY — DX: Other hypersomnia: G47.19

## 2017-11-22 LAB — HM COLONOSCOPY

## 2017-12-13 ENCOUNTER — Other Ambulatory Visit: Payer: Self-pay

## 2017-12-13 ENCOUNTER — Emergency Department (HOSPITAL_BASED_OUTPATIENT_CLINIC_OR_DEPARTMENT_OTHER)
Admission: EM | Admit: 2017-12-13 | Discharge: 2017-12-13 | Disposition: A | Discharge status: Home / Self Care | Payer: No Typology Code available for payment source | Attending: Emergency Medicine | Admitting: Emergency Medicine

## 2017-12-13 ENCOUNTER — Encounter (HOSPITAL_BASED_OUTPATIENT_CLINIC_OR_DEPARTMENT_OTHER): Payer: Self-pay | Admitting: Emergency Medicine

## 2017-12-13 DIAGNOSIS — I10 Essential (primary) hypertension: Secondary | ICD-10-CM | POA: Insufficient documentation

## 2017-12-13 DIAGNOSIS — Y999 Unspecified external cause status: Secondary | ICD-10-CM | POA: Diagnosis not present

## 2017-12-13 DIAGNOSIS — S3992XA Unspecified injury of lower back, initial encounter: Secondary | ICD-10-CM | POA: Diagnosis present

## 2017-12-13 DIAGNOSIS — Y92410 Unspecified street and highway as the place of occurrence of the external cause: Secondary | ICD-10-CM | POA: Insufficient documentation

## 2017-12-13 DIAGNOSIS — S39012A Strain of muscle, fascia and tendon of lower back, initial encounter: Secondary | ICD-10-CM | POA: Insufficient documentation

## 2017-12-13 DIAGNOSIS — Y9389 Activity, other specified: Secondary | ICD-10-CM | POA: Diagnosis not present

## 2017-12-13 DIAGNOSIS — Z79899 Other long term (current) drug therapy: Secondary | ICD-10-CM | POA: Insufficient documentation

## 2017-12-13 MED ORDER — METHYLPREDNISOLONE 4 MG PO TBPK: ORAL_TABLET | ORAL | 0 refills | Status: DC

## 2017-12-13 MED ORDER — ORPHENADRINE CITRATE ER 100 MG PO TB12: 100.0000 mg | ORAL_TABLET | Freq: Two times a day (BID) | ORAL | 0 refills | Status: DC
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
## 2018-04-24 ENCOUNTER — Emergency Department (HOSPITAL_BASED_OUTPATIENT_CLINIC_OR_DEPARTMENT_OTHER)
Admission: EM | Admit: 2018-04-24 | Discharge: 2018-04-24 | Disposition: A | Discharge status: Home / Self Care | Payer: No Typology Code available for payment source | Attending: Emergency Medicine | Admitting: Emergency Medicine

## 2018-04-24 ENCOUNTER — Encounter (HOSPITAL_BASED_OUTPATIENT_CLINIC_OR_DEPARTMENT_OTHER): Payer: Self-pay

## 2018-04-24 ENCOUNTER — Other Ambulatory Visit: Payer: Self-pay

## 2018-04-24 ENCOUNTER — Emergency Department (HOSPITAL_BASED_OUTPATIENT_CLINIC_OR_DEPARTMENT_OTHER): Payer: No Typology Code available for payment source

## 2018-04-24 DIAGNOSIS — R1031 Right lower quadrant pain: Secondary | ICD-10-CM | POA: Insufficient documentation

## 2018-04-24 DIAGNOSIS — Z79899 Other long term (current) drug therapy: Secondary | ICD-10-CM | POA: Diagnosis not present

## 2018-04-24 DIAGNOSIS — I10 Essential (primary) hypertension: Secondary | ICD-10-CM | POA: Diagnosis not present

## 2018-04-24 DIAGNOSIS — R109 Unspecified abdominal pain: Secondary | ICD-10-CM

## 2018-04-24 LAB — URINALYSIS, ROUTINE W REFLEX MICROSCOPIC
Bilirubin Urine: NEGATIVE
Glucose, UA: NEGATIVE mg/dL
Ketones, ur: 15 mg/dL — AB
LEUKOCYTES UA: NEGATIVE
NITRITE: NEGATIVE
PH: 6 (ref 5.0–8.0)
Protein, ur: NEGATIVE mg/dL
Specific Gravity, Urine: 1.025 (ref 1.005–1.030)

## 2018-04-24 LAB — COMPREHENSIVE METABOLIC PANEL
ALT: 15 U/L (ref 0–44)
AST: 17 U/L (ref 15–41)
Albumin: 4.1 g/dL (ref 3.5–5.0)
Alkaline Phosphatase: 82 U/L (ref 38–126)
Anion gap: 9 (ref 5–15)
BUN: 13 mg/dL (ref 6–20)
CO2: 24 mmol/L (ref 22–32)
CREATININE: 0.94 mg/dL (ref 0.44–1.00)
Calcium: 8.7 mg/dL — ABNORMAL LOW (ref 8.9–10.3)
Chloride: 105 mmol/L (ref 98–111)
Glucose, Bld: 93 mg/dL (ref 70–99)
Potassium: 3.9 mmol/L (ref 3.5–5.1)
SODIUM: 138 mmol/L (ref 135–145)
Total Bilirubin: 0.5 mg/dL (ref 0.3–1.2)
Total Protein: 7.7 g/dL (ref 6.5–8.1)

## 2018-04-24 LAB — URINALYSIS, MICROSCOPIC (REFLEX)

## 2018-04-24 LAB — CBC
HCT: 34 % — ABNORMAL LOW (ref 36.0–46.0)
HEMOGLOBIN: 11.2 g/dL — AB (ref 12.0–15.0)
MCH: 29.9 pg (ref 26.0–34.0)
MCHC: 32.9 g/dL (ref 30.0–36.0)
MCV: 90.9 fL (ref 78.0–100.0)
Platelets: 216 10*3/uL (ref 150–400)
RBC: 3.74 MIL/uL — AB (ref 3.87–5.11)
RDW: 13 % (ref 11.5–15.5)
WBC: 7 10*3/uL (ref 4.0–10.5)

## 2018-04-24 MED ORDER — MORPHINE SULFATE (PF) 4 MG/ML IV SOLN
4.0000 mg | Freq: Once | INTRAVENOUS | Status: AC
  Administered 2018-04-24: 4 mg via INTRAVENOUS
  Filled 2018-04-24: qty 1

## 2018-04-24 NOTE — Discharge Instructions (Addendum)
Ibuprofen and tylenol for symptoms  Follow up with your primary care provider  Return to the ER as needed for new or worsening symptoms

## 2018-04-24 NOTE — ED Triage Notes (Signed)
C/o intermittent RLQ pain x 2 week-steady gait/grimacing and holding area

## 2018-04-24 NOTE — ED Provider Notes (Signed)
[facility] EMERGENCY DEPARTMENT Provider Note   CSN: [id number] Arrival date & time: 04/24/18  1221     History   Chief Complaint Chief Complaint  Patient presents with  . Abdominal Pain    HPI Denise Mejia is a 57 y.o. female.  HPI Patient is a 57 year old female who presents with 2 weeks of study right lower quadrant abdominal pain.  Initially began as colicky right-sided abdominal pain but now is constant located in the right lower quadrant.  No dysuria or urinary frequency.  Normal bowel movements.  Denies nausea vomiting.  No history of kidney stones.  Symptoms have been constant and moderate in severity.   Past Medical History:  Diagnosis Date  . Bronchitis    reports hx of frequent  . Depression    pt states she does not have history of depresssion 02-18-2014  . Fibroid   . GERD (gastroesophageal reflux disease)   . Hypertension     Patient Active Problem List   Diagnosis Date Noted  . Essential hypertension 05/14/2013  . Murmur 05/14/2013  . Bronchitis   . Fibroid   . Depression     Past Surgical History:  Procedure Laterality Date  . ABDOMINAL HYSTERECTOMY    . CARPAL TUNNEL RELEASE     bilateral  . INDUCED ABORTION     X2     OB History    Gravida  3   Para  1   Term  1   Preterm  0   AB  2   Living  1     SAB  0   TAB  2   Ectopic  0   Multiple  0   Live Births  1            Home Medications    Prior to Admission medications   Medication Sig Start Date End Date Taking? Authorizing Provider  albuterol (PROVENTIL HFA;VENTOLIN HFA) 108 (90 Base) MCG/ACT inhaler Inhale 2 puffs into the lungs 4 (four) times daily as needed. 02/18/14   [provider]  ibuprofen (ADVIL,MOTRIN) 600 MG tablet Take 1 tablet (600 mg total) by mouth every 6 (six) hours as needed. 07/11/16   Kirichenko, Lahoma Rocker, PA-C  methylPREDNISolone (MEDROL DOSEPAK) 4 MG TBPK tablet Per Dosepak instructions 12/13/17   Charlesetta Shanks, MD    metoprolol succinate (TOPROL-XL) 25 MG 24 hr tablet Take 1 tablet by mouth every morning. 10/11/17   [provider]  orphenadrine (NORFLEX) 100 MG tablet Take 1 tablet (100 mg total) by mouth 2 (two) times daily. 12/13/17   Charlesetta Shanks, MD  spironolactone (ALDACTONE) 25 MG tablet Take 1 tablet by mouth daily. 10/11/17   [provider]    Family History Family History  Problem Relation Age of Onset  . Stroke Mother     Social History Social History   Tobacco Use  . Smoking status: Never Smoker  . Smokeless tobacco: Never Used  Substance Use Topics  . Alcohol use: No  . Drug use: No     Allergies   Lisinopril   Review of Systems Review of Systems  All other systems reviewed and are negative.    Physical Exam Updated Vital Signs BP (!) 133/50 (BP Location: Left Arm)   Pulse (!) 57   Temp 98.9 F (37.2 C) (Oral)   Resp 20   Ht 6' (1.829 m)   Wt 103 kg   SpO2 99%   BMI 30.80 kg/m   Physical Exam  Constitutional: She is oriented to person, place, and time. She appears well-developed and well-nourished.  HENT:  Head: Normocephalic.  Eyes: EOM are normal.  Neck: Normal range of motion.  Cardiovascular: Normal rate.  Pulmonary/Chest: Effort normal and breath sounds normal.  Abdominal: Soft. She exhibits no distension. There is no tenderness.  Musculoskeletal: Normal range of motion.  Neurological: She is alert and oriented to person, place, and time.  Psychiatric: She has a normal mood and affect.  Nursing note and vitals reviewed.    ED Treatments / Results  Labs (all labs ordered are listed, but only abnormal results are displayed) Labs Reviewed  URINALYSIS, ROUTINE W REFLEX MICROSCOPIC - Abnormal; Notable for the following components:      Result Value   Hgb urine dipstick TRACE (*)    Ketones, ur 15 (*)    All other components within normal limits  CBC - Abnormal; Notable for the following components:   RBC 3.74 (*)     Hemoglobin 11.2 (*)    HCT 34.0 (*)    All other components within normal limits  COMPREHENSIVE METABOLIC PANEL - Abnormal; Notable for the following components:   Calcium 8.7 (*)    All other components within normal limits  URINALYSIS, MICROSCOPIC (REFLEX) - Abnormal; Notable for the following components:   Bacteria, UA FEW (*)    All other components within normal limits    EKG None  Radiology Ct Renal Stone Study  Result Date: 04/24/2018 CLINICAL DATA:  Right flank pain EXAM: CT ABDOMEN AND PELVIS WITHOUT CONTRAST TECHNIQUE: Multidetector CT imaging of the abdomen and pelvis was performed following the standard protocol without IV contrast. COMPARISON:  None. FINDINGS: Lower chest: Negative Hepatobiliary: Normal liver gallbladder and bile ducts. Pancreas: Negative Spleen: Negative Adrenals/Urinary Tract: Negative for renal obstruction. No renal calculi or mass lesion. Bladder is empty and appears normal. Small calcification in the right mid pelvis on axial image 66 is felt to be a phlebolith. Stomach/Bowel: Normal stomach and small bowel. Negative for bowel obstruction. No bowel mass or edema. Normal appendix. Vascular/Lymphatic: Negative Reproductive: Hysterectomy.  No pelvic mass. Other: Negative for free fluid Musculoskeletal: Negative IMPRESSION: No acute abnormality.  Negative for urinary tract calculi. Normal appendix Electronically Signed   By: Franchot Gallo M.D.   On: 04/24/2018 13:31    Procedures Procedures (including critical care time)  Medications Ordered in ED Medications  morphine 4 MG/ML injection 4 mg (4 mg Intravenous Given 04/24/18 1301)     Initial Impression / Assessment and Plan / ED Course  I have reviewed the triage vital signs and the nursing notes.  Pertinent labs & imaging results that were available during my care of the patient were reviewed by me and considered in my medical decision making (see chart for details).     No clear etiology for the  patient's right lower quadrant abdominal pain at this time.  Primary care follow-up.  CT imaging without acute pathology.  Urine and labs without significant abnormality.  Patient understands return to the emergency department for new or worsening symptoms  Final Clinical Impressions(s) / ED Diagnoses   Final diagnoses:  Abdominal pain, unspecified abdominal location    ED Discharge Orders    None       Jola Schmidt, MD 04/24/18 1352

## 2018-09-19 DIAGNOSIS — E6609 Other obesity due to excess calories: Secondary | ICD-10-CM | POA: Insufficient documentation

## 2018-09-19 HISTORY — DX: Other obesity due to excess calories: E66.09

## 2018-12-25 ENCOUNTER — Emergency Department (HOSPITAL_BASED_OUTPATIENT_CLINIC_OR_DEPARTMENT_OTHER): Payer: No Typology Code available for payment source

## 2018-12-25 ENCOUNTER — Other Ambulatory Visit: Payer: Self-pay

## 2018-12-25 ENCOUNTER — Emergency Department (HOSPITAL_BASED_OUTPATIENT_CLINIC_OR_DEPARTMENT_OTHER)
Admission: EM | Admit: 2018-12-25 | Discharge: 2018-12-25 | Disposition: A | Discharge status: Home / Self Care | Payer: No Typology Code available for payment source | Attending: Emergency Medicine | Admitting: Emergency Medicine

## 2018-12-25 ENCOUNTER — Encounter (HOSPITAL_BASED_OUTPATIENT_CLINIC_OR_DEPARTMENT_OTHER): Payer: Self-pay | Admitting: Emergency Medicine

## 2018-12-25 DIAGNOSIS — R079 Chest pain, unspecified: Secondary | ICD-10-CM | POA: Diagnosis present

## 2018-12-25 DIAGNOSIS — Z79899 Other long term (current) drug therapy: Secondary | ICD-10-CM | POA: Diagnosis not present

## 2018-12-25 DIAGNOSIS — I10 Essential (primary) hypertension: Secondary | ICD-10-CM | POA: Insufficient documentation

## 2018-12-25 LAB — BASIC METABOLIC PANEL
Anion gap: 6 (ref 5–15)
BUN: 15 mg/dL (ref 6–20)
CO2: 26 mmol/L (ref 22–32)
Calcium: 9.5 mg/dL (ref 8.9–10.3)
Chloride: 105 mmol/L (ref 98–111)
Creatinine, Ser: 0.93 mg/dL (ref 0.44–1.00)
GFR calc Af Amer: >60 mL/min (ref >60)
GFR calc non Af Amer: >60 mL/min (ref >60)
Glucose, Bld: 105 mg/dL — ABNORMAL HIGH (ref 70–99)
Potassium: 4.2 mmol/L (ref 3.5–5.1)
Sodium: 137 mmol/L (ref 135–145)

## 2018-12-25 LAB — CBC
HCT: 33.6 % — ABNORMAL LOW (ref 36.0–46.0)
Hemoglobin: 11 g/dL — ABNORMAL LOW (ref 12.0–15.0)
MCH: 30.1 pg (ref 26.0–34.0)
MCHC: 32.7 g/dL (ref 30.0–36.0)
MCV: 91.8 fL (ref 80.0–100.0)
Platelets: 221 10*3/uL (ref 150–400)
RBC: 3.66 MIL/uL — ABNORMAL LOW (ref 3.87–5.11)
RDW: 13.1 % (ref 11.5–15.5)
WBC: 6.9 10*3/uL (ref 4.0–10.5)
nRBC: 0 % (ref 0.0–0.2)

## 2018-12-25 LAB — TROPONIN I
Troponin I: <0.03 ng/mL (ref <0.03)
Troponin I: <0.03 ng/mL (ref <0.03)

## 2018-12-25 NOTE — ED Notes (Signed)
ED Provider at bedside. 

## 2018-12-25 NOTE — ED Notes (Signed)
ED Provider at bedside discussing test results and dispo plan of care. 

## 2018-12-25 NOTE — ED Triage Notes (Signed)
States she woke up this morning with chest pain, feels like a pulled muscle. Also reports feeling palpitations.

## 2018-12-25 NOTE — ED Notes (Signed)
Patient transported to X-ray 

## 2018-12-25 NOTE — ED Provider Notes (Signed)
[facility] Provider Note   CSN: [id number] Arrival date & time: 12/25/18  1205    History   Chief Complaint Chief Complaint  Patient presents with   Chest Pain    HPI Denise Mejia is a 58 y.o. female.     HPI   Today at work began to feel some tightness in chest around 730AM. Located left side of the chest.  No radiation to arm today, but last week did have some arm pain.   Pain lasted almost 3 hours.  After got up and moving around did feel better then started again. When bent over felt more like a pulled muscle, sharper pain.  No shortness of breath, nausea, diaphoresis.   No leg pain or swelling.  No other episodes other than last week and today. Did see heart doctor last year in [place] for similar symptoms, did not have stress test although they had talked about doing it Had palpitations for about 5-6 minutes  No smoking, no other drugs or alcohol No family hx of early CAD Htn, mild elevation in cholesterol now diet controlled   Past Medical History:  Diagnosis Date   Bronchitis    reports hx of frequent   Depression    pt states she does not have history of depresssion 02-18-2014   Fibroid    GERD (gastroesophageal reflux disease)    Hypertension     Patient Active Problem List   Diagnosis Date Noted   Essential hypertension 05/14/2013   Murmur 05/14/2013   Bronchitis    Fibroid    Depression     Past Surgical History:  Procedure Laterality Date   ABDOMINAL HYSTERECTOMY     CARPAL TUNNEL RELEASE     bilateral   INDUCED ABORTION     X2     OB History    Gravida  3   Para  1   Term  1   Preterm  0   AB  2   Living  1     SAB  0   TAB  2   Ectopic  0   Multiple  0   Live Births  1            Home Medications    Prior to Admission medications   Medication Sig Start Date End Date Taking? Authorizing Provider  albuterol (PROVENTIL HFA;VENTOLIN HFA) 108 (90 Base) MCG/ACT  inhaler Inhale 2 puffs into the lungs 4 (four) times daily as needed. 02/18/14   [provider]  ibuprofen (ADVIL,MOTRIN) 600 MG tablet Take 1 tablet (600 mg total) by mouth every 6 (six) hours as needed. 07/11/16   Kirichenko, Lahoma Rocker, PA-C  methylPREDNISolone (MEDROL DOSEPAK) 4 MG TBPK tablet Per Dosepak instructions 12/13/17   Charlesetta Shanks, MD  metoprolol succinate (TOPROL-XL) 25 MG 24 hr tablet Take 1 tablet by mouth every morning. 10/11/17   [provider]  orphenadrine (NORFLEX) 100 MG tablet Take 1 tablet (100 mg total) by mouth 2 (two) times daily. 12/13/17   Charlesetta Shanks, MD  spironolactone (ALDACTONE) 25 MG tablet Take 1 tablet by mouth daily. 10/11/17   [provider]    Family History Family History  Problem Relation Age of Onset   Stroke Mother     Social History Social History   Tobacco Use   Smoking status: Never Smoker   Smokeless tobacco: Never Used  Substance Use Topics   Alcohol use: No   Drug use: No  Allergies   Lisinopril   Review of Systems Review of Systems  Constitutional: Negative for fever.  HENT: Negative for sore throat.   Eyes: Negative for visual disturbance.  Respiratory: Negative for cough and shortness of breath.   Cardiovascular: Positive for chest pain and palpitations.  Gastrointestinal: Negative for abdominal pain, nausea and vomiting.  Genitourinary: Negative for difficulty urinating.  Musculoskeletal: Negative for back pain and neck pain.  Skin: Negative for rash.  Neurological: Negative for syncope and headaches.     Physical Exam Updated Vital Signs BP (!) 155/70 (BP Location: Right Arm)    Pulse 61    Temp 98.4 F (36.9 C) (Oral)    Resp 16    Ht 6\' 1"  (1.854 m)    Wt 102.1 kg    SpO2 99%    BMI 29.69 kg/m   Physical Exam Vitals signs and nursing note reviewed.  Constitutional:      General: She is not in acute distress.    Appearance: She is well-developed. She is not diaphoretic.    HENT:     Head: Normocephalic and atraumatic.  Eyes:     Conjunctiva/sclera: Conjunctivae normal.  Neck:     Musculoskeletal: Normal range of motion.  Cardiovascular:     Rate and Rhythm: Normal rate and regular rhythm.     Heart sounds: Normal heart sounds. No murmur. No friction rub. No gallop.   Pulmonary:     Effort: Pulmonary effort is normal. No respiratory distress.     Breath sounds: Normal breath sounds. No wheezing or rales.  Abdominal:     General: There is no distension.     Palpations: Abdomen is soft.     Tenderness: There is no abdominal tenderness. There is no guarding.  Musculoskeletal:        General: No tenderness.  Skin:    General: Skin is warm and dry.     Findings: No erythema or rash.  Neurological:     Mental Status: She is alert and oriented to person, place, and time.      ED Treatments / Results  Labs (all labs ordered are listed, but only abnormal results are displayed) Labs Reviewed  BASIC METABOLIC PANEL - Abnormal; Notable for the following components:      Result Value   Glucose, Bld 105 (*)    All other components within normal limits  CBC - Abnormal; Notable for the following components:   RBC 3.66 (*)    Hemoglobin 11.0 (*)    HCT 33.6 (*)    All other components within normal limits  TROPONIN I  TROPONIN I    EKG EKG Interpretation  Date/Time:  Wednesday December 25 2018 12:16:40 EDT Ventricular Rate:  67 PR Interval:    QRS Duration: 87 QT Interval:  394 QTC Calculation: 416 R Axis:   6 Text Interpretation:  Sinus rhythm Left ventricular hypertrophy No significant change since last tracing Confirmed by Gareth Morgan [phone]) on 12/25/2018 12:57:29 PM   Radiology Dg Chest 2 View  Result Date: 12/25/2018 CLINICAL DATA:  Chest pain and palpitations. EXAM: CHEST - 2 VIEW COMPARISON:  03/30/2017 FINDINGS: The cardiac silhouette remains mildly enlarged. The lungs are slightly hypoinflated. No airspace consolidation, edema,  pleural effusion, pneumothorax is identified. No acute osseous abnormality is seen. IMPRESSION: No active cardiopulmonary disease. Electronically Signed   By: Logan Bores M.D.   On: 12/25/2018 13:15    Procedures Procedures (including critical care time)  Medications Ordered in ED Medications -  No data to display   Initial Impression / Assessment and Plan / ED Course  I have reviewed the triage vital signs and the nursing notes.  Pertinent labs & imaging results that were available during my care of the patient were reviewed by me and considered in my medical decision making (see chart for details).        58yo female with history of hypertension presents with concern for chest pain.  EKG was done and evaluate by me and showed no acute ST changes and no signs of pericarditis. Chest x-ray was done and evaluated by me and radiology and showed no sign of pneumonia or pneumothorax.  Low suspicion for PE given no pleuritic pain, no hypoxia, no tachypnea, no dyspnea. Patient is low risk HEART score and had delta troponins which were both negative.  Did discuss recommendation for 3 hour troponin however patient reports she does not want to wait for 3 hour and understands risks. Second troponin greater than 6 hours after CP started and doubt ACS.  Given this evaluation, history and physical have low suspicion for pulmonary embolus, pneumonia, ACS, myocarditis, pericarditis, dissection. CP free while in ED.   Recommend outpt Cardiology follow up and discussed return precautions in detail. Patient discharged in stable condition with understanding of reasons to return.    Final Clinical Impressions(s) / ED Diagnoses   Final diagnoses:  Chest pain, unspecified type    ED Discharge Orders    None       Gareth Morgan, MD 12/25/18 2120

## 2019-02-07 ENCOUNTER — Emergency Department (HOSPITAL_BASED_OUTPATIENT_CLINIC_OR_DEPARTMENT_OTHER): Payer: No Typology Code available for payment source

## 2019-02-07 ENCOUNTER — Other Ambulatory Visit: Payer: Self-pay

## 2019-02-07 ENCOUNTER — Encounter (HOSPITAL_BASED_OUTPATIENT_CLINIC_OR_DEPARTMENT_OTHER): Payer: Self-pay

## 2019-02-07 ENCOUNTER — Emergency Department (HOSPITAL_BASED_OUTPATIENT_CLINIC_OR_DEPARTMENT_OTHER)
Admission: EM | Admit: 2019-02-07 | Discharge: 2019-02-07 | Disposition: A | Discharge status: Home / Self Care | Payer: No Typology Code available for payment source | Attending: Emergency Medicine | Admitting: Emergency Medicine

## 2019-02-07 DIAGNOSIS — Y998 Other external cause status: Secondary | ICD-10-CM | POA: Insufficient documentation

## 2019-02-07 DIAGNOSIS — S161XXA Strain of muscle, fascia and tendon at neck level, initial encounter: Secondary | ICD-10-CM

## 2019-02-07 DIAGNOSIS — I10 Essential (primary) hypertension: Secondary | ICD-10-CM | POA: Insufficient documentation

## 2019-02-07 DIAGNOSIS — Y92410 Unspecified street and highway as the place of occurrence of the external cause: Secondary | ICD-10-CM | POA: Insufficient documentation

## 2019-02-07 DIAGNOSIS — S20211A Contusion of right front wall of thorax, initial encounter: Secondary | ICD-10-CM | POA: Insufficient documentation

## 2019-02-07 DIAGNOSIS — Y9389 Activity, other specified: Secondary | ICD-10-CM | POA: Insufficient documentation

## 2019-02-07 DIAGNOSIS — Z79899 Other long term (current) drug therapy: Secondary | ICD-10-CM | POA: Insufficient documentation

## 2019-02-07 DIAGNOSIS — S298XXA Other specified injuries of thorax, initial encounter: Secondary | ICD-10-CM

## 2019-02-07 MED ORDER — CYCLOBENZAPRINE HCL 10 MG PO TABS: 10.0000 mg | ORAL_TABLET | Freq: Two times a day (BID) | ORAL | 0 refills | Status: DC | PRN
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
## 2019-02-07 NOTE — Discharge Instructions (Signed)
Take over-the-counter medications as needed for pain, take the muscle relaxant as prescribed, turn as needed for worsening symptoms but you can expect to be stiff and sore for at least the next week or so
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
## 2019-06-18 ENCOUNTER — Other Ambulatory Visit: Payer: Self-pay

## 2019-06-18 DIAGNOSIS — Z20822 Contact with and (suspected) exposure to covid-19: Secondary | ICD-10-CM

## 2019-06-19 LAB — NOVEL CORONAVIRUS, NAA: SARS-CoV-2, NAA: NOT DETECTED
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
## 2020-01-24 ENCOUNTER — Emergency Department (HOSPITAL_BASED_OUTPATIENT_CLINIC_OR_DEPARTMENT_OTHER)
Admission: EM | Admit: 2020-01-24 | Discharge: 2020-01-24 | Disposition: A | Discharge status: Home / Self Care | Payer: No Typology Code available for payment source | Attending: Emergency Medicine | Admitting: Emergency Medicine

## 2020-01-24 ENCOUNTER — Encounter (HOSPITAL_BASED_OUTPATIENT_CLINIC_OR_DEPARTMENT_OTHER): Payer: Self-pay | Admitting: Emergency Medicine

## 2020-01-24 ENCOUNTER — Other Ambulatory Visit: Payer: Self-pay

## 2020-01-24 DIAGNOSIS — R6884 Jaw pain: Secondary | ICD-10-CM | POA: Insufficient documentation

## 2020-01-24 DIAGNOSIS — I10 Essential (primary) hypertension: Secondary | ICD-10-CM | POA: Insufficient documentation

## 2020-01-24 DIAGNOSIS — R432 Parageusia: Secondary | ICD-10-CM | POA: Insufficient documentation

## 2020-01-24 MED ORDER — METHOCARBAMOL 750 MG PO TABS: 750.0000 mg | ORAL_TABLET | Freq: Two times a day (BID) | ORAL | 0 refills | Status: AC | PRN

## 2020-01-24 MED ORDER — NAPROXEN 500 MG PO TABS: 500.0000 mg | ORAL_TABLET | Freq: Two times a day (BID) | ORAL | 0 refills | Status: AC

## 2020-01-24 NOTE — ED Notes (Signed)
Pt discharged to home. Discharge instructions have been discussed with patient and/or family members. Pt verbally acknowledges understanding d/c instructions, and endorses comprehension to checkout at registration before leaving.  °

## 2020-01-24 NOTE — ED Provider Notes (Signed)
[facility] Provider Note   CSN: [id number] Arrival date & time: 01/24/20  1605     History Chief Complaint  Patient presents with  . Jaw Pain    Victor Scheiner is a 59 y.o. female.  HPI   Patient is a 59 year old female with a history of bronchitis, depression, fibroids, GERD, hypertension, who presents the emergency department today for evaluation of right-sided jaw pain.  She states that this has been ongoing for at least the last 2 months.  She also feels like the right side of her face is swollen.  Describes the pain as a shooting pain.  She also reports that she has a small area of numbness to the right side of the lower lip.  She also feels like she has decreased sense of taste on the right side of her tongue.  She denies any other facial numbness, or extremity numbness/weakness.  States she saw her PCP about this and was told to follow-up with a dentist.  On review of records, patient had indicated that symptoms have been ongoing for at least a year following a dental procedure.  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  Past Medical History:  Diagnosis Date  . Bronchitis    reports hx of frequent  . Depression    pt states she does not have history of depresssion 02-18-2014  . Fibroid   . GERD (gastroesophageal reflux disease)   . Hypertension     Patient Active Problem List   Diagnosis Date Noted  . Essential hypertension 05/14/2013  . Murmur 05/14/2013  . Bronchitis   . Fibroid   . Depression     Past Surgical History:  Procedure Laterality Date  . ABDOMINAL HYSTERECTOMY    . CARPAL TUNNEL RELEASE     bilateral  . INDUCED ABORTION     X2     OB History    Gravida  3   Para  1   Term  1   Preterm  0   AB  2   Living  1     SAB  0   TAB  2   Ectopic  0   Multiple  0   Live Births  1           Family History  Problem Relation Age of Onset  . Stroke Mother     Social History   Tobacco Use   . Smoking status: Never Smoker  . Smokeless tobacco: Never Used  Substance Use Topics  . Alcohol use: No  . Drug use: No    Home Medications Prior to Admission medications   Medication Sig Start Date End Date Taking? Authorizing Provider  albuterol (PROVENTIL HFA;VENTOLIN HFA) 108 (90 Base) MCG/ACT inhaler Inhale 2 puffs into the lungs 4 (four) times daily as needed. 02/18/14   [provider]  cyclobenzaprine (FLEXERIL) 10 MG tablet Take 1 tablet (10 mg total) by mouth 2 (two) times daily as needed for muscle spasms. 02/07/19   [name], MD  ibuprofen (ADVIL,MOTRIN) 600 MG tablet Take 1 tablet (600 mg total) by mouth every 6 (six) hours as needed. 07/11/16   Kirichenko, Lahoma Rocker, PA-C  methocarbamol (ROBAXIN) 750 MG tablet Take 1 tablet (750 mg total) by mouth 2 (two) times daily as needed for up to 5 days for muscle spasms. 01/24/20 01/29/20  Domonic Kimball S, PA-C  methylPREDNISolone (MEDROL DOSEPAK) 4 MG TBPK tablet Per Dosepak instructions 12/13/17   Charlesetta Shanks, MD  metoprolol succinate (TOPROL-XL) 25 MG 24 hr tablet Take 1 tablet by mouth every morning. 10/11/17   [provider]  naproxen (NAPROSYN) 500 MG tablet Take 1 tablet (500 mg total) by mouth 2 (two) times daily with a meal for 7 days. 01/24/20 01/31/20  Naomee Nowland S, PA-C  orphenadrine (NORFLEX) 100 MG tablet Take 1 tablet (100 mg total) by mouth 2 (two) times daily. 12/13/17   Charlesetta Shanks, MD  spironolactone (ALDACTONE) 25 MG tablet Take 1 tablet by mouth daily. 10/11/17   [provider]    Allergies    Lisinopril  Review of Systems   Review of Systems  Constitutional: Negative for fever.  HENT:       Right jaw pain, decreased taste on right side of tongue  Neurological: Positive for numbness (right lower lip).    Physical Exam Updated Vital Signs BP (!) 168/85 (BP Location: Right Arm)   Pulse 86   Temp 98.6 F (37 C) (Oral)   Resp 16   Ht 6\' 1"  (1.854 m)   Wt 102.1 kg   SpO2  98%   BMI 29.69 kg/m   Physical Exam Vitals and nursing note reviewed.  Constitutional:      General: She is not in acute distress.    Appearance: She is well-developed.  HENT:     Head: Normocephalic.     Mouth/Throat:     Mouth: Mucous membranes are moist.     Pharynx: No oropharyngeal exudate or posterior oropharyngeal erythema.     Comments: Multiple missing teeth. No obvious intraoral abscess or gingival changes. No trismus. Decreased sensation to the right lower lip. TTP to the right TMJ which reproduces her pain.  Eyes:     Conjunctiva/sclera: Conjunctivae normal.  Cardiovascular:     Rate and Rhythm: Normal rate.  Pulmonary:     Effort: Pulmonary effort is normal.  Musculoskeletal:        General: Normal range of motion.     Cervical back: Neck supple.  Skin:    General: Skin is warm and dry.  Neurological:     Mental Status: She is alert.     Comments: Mental Status:  Alert, thought content appropriate, able to give a coherent history. Speech fluent without evidence of aphasia. Able to follow 2 step commands without difficulty.  Cranial Nerves:  II: pupils equal, round, reactive to light III,IV, VI: ptosis not present, extra-ocular motions intact bilaterally  V,VII: smile symmetric, facial light touch sensation equal VIII: hearing grossly normal to voice  X: uvula elevates symmetrically  XI: bilateral shoulder shrug symmetric and strong XII: midline tongue extension without fassiculations Motor:  Normal tone. 5/5 strength of BUE and BLE major muscle groups including strong and equal grip strength and dorsiflexion/plantar flexion Sensory: light touch normal in all extremities.     ED Results / Procedures / Treatments   Labs (all labs ordered are listed, but only abnormal results are displayed) Labs Reviewed - No data to display  EKG None  Radiology No results found.  Procedures Procedures (including critical care time)  Medications Ordered in  ED Medications - No data to display  ED Course  I have reviewed the triage vital signs and the nursing notes.  Pertinent labs & imaging results that were available during my care of the patient were reviewed by me and considered in my medical decision making (see chart for details).    MDM Rules/Calculators/A&P  59 year old female presenting for evaluation of jaw pain ongoing for the last 1 to 2 months.  Pain located the right side of the jaw.  On exam she does have tenderness to the TMJ which reproduces symptoms.  Advised to buy bite guard over-the-counter.  Will give Rx for anti-inflammatory medications and muscle relaxers.  She is also complaining that she has some numbness to the right lower leg and decreased ability to taste to the right side of her tongue.  On exam she does not have any intraoral lesions or signs of infection.  No trismus.  She otherwise has a normal neurologic exam.  Unclear etiology of her symptoms at this time though may be related to prior dental procedure that could have caused persistent nerve damage.  We will have her follow-up with ENT in regards to both issues.  Have also advised her to follow-up with PCP.  Discussed return precautions.  She voiced understanding plan reasons to return.  Questions answered.  Patient stable for discharge.  Final Clinical Impression(s) / ED Diagnoses Final diagnoses:  Jaw pain  Dysgeusia    Rx / DC Orders ED Discharge Orders         Ordered    naproxen (NAPROSYN) 500 MG tablet  2 times daily with meals     01/24/20 1637    methocarbamol (ROBAXIN) 750 MG tablet  2 times daily PRN     01/24/20 1637           Dvante Hands, [employer], PA-C 01/24/20 1643    Isla Pence, MD 01/24/20 1651

## 2020-01-24 NOTE — ED Triage Notes (Signed)
R jaw pain x 2 months. Hx of TMJ. Also states something is wrong with taste buds on her tongue.

## 2020-01-24 NOTE — Discharge Instructions (Addendum)
Take the prescription as directed.  You were given a prescription for an anti-inflammatory medication and a muscle relaxer. You should not drive, work, or operate machinery while the [employer] stage [employer] as it can make you very drowsy.    You should buy a bite guard and wear it at night to help with your jaw pain.   In regards to the loss of taste on the right side of your tongue, you were given information of follow-up with an ear nose and throat doctor to further evaluate this.  Please call the office to schedule an appointment for follow-up about this.

## 2020-02-12 DIAGNOSIS — M5412 Radiculopathy, cervical region: Secondary | ICD-10-CM

## 2020-02-12 HISTORY — DX: Radiculopathy, cervical region: M54.12

## 2020-03-22 ENCOUNTER — Other Ambulatory Visit: Payer: Self-pay

## 2020-03-22 ENCOUNTER — Emergency Department (HOSPITAL_BASED_OUTPATIENT_CLINIC_OR_DEPARTMENT_OTHER): Payer: 59

## 2020-03-22 ENCOUNTER — Emergency Department (HOSPITAL_BASED_OUTPATIENT_CLINIC_OR_DEPARTMENT_OTHER)
Admission: EM | Admit: 2020-03-22 | Discharge: 2020-03-22 | Disposition: A | Discharge status: Home / Self Care | Payer: 59 | Attending: Emergency Medicine | Admitting: Emergency Medicine

## 2020-03-22 ENCOUNTER — Encounter (HOSPITAL_BASED_OUTPATIENT_CLINIC_OR_DEPARTMENT_OTHER): Payer: Self-pay | Admitting: *Deleted

## 2020-03-22 DIAGNOSIS — R079 Chest pain, unspecified: Secondary | ICD-10-CM | POA: Diagnosis not present

## 2020-03-22 DIAGNOSIS — R202 Paresthesia of skin: Secondary | ICD-10-CM | POA: Diagnosis not present

## 2020-03-22 DIAGNOSIS — Z5321 Procedure and treatment not carried out due to patient leaving prior to being seen by health care provider: Secondary | ICD-10-CM | POA: Diagnosis not present

## 2020-03-22 DIAGNOSIS — M79602 Pain in left arm: Secondary | ICD-10-CM | POA: Insufficient documentation

## 2020-03-22 NOTE — ED Triage Notes (Addendum)
Pt c/o chest pain x 1 day and left arm pain/ numbness  x 3 weeks

## 2020-04-03 ENCOUNTER — Observation Stay (HOSPITAL_BASED_OUTPATIENT_CLINIC_OR_DEPARTMENT_OTHER)
Admission: EM | Admit: 2020-04-03 | Discharge: 2020-04-04 | Disposition: A | Discharge status: Home / Self Care | Payer: 59 | Attending: Internal Medicine | Admitting: Internal Medicine

## 2020-04-03 ENCOUNTER — Other Ambulatory Visit: Payer: Self-pay

## 2020-04-03 ENCOUNTER — Emergency Department (HOSPITAL_BASED_OUTPATIENT_CLINIC_OR_DEPARTMENT_OTHER): Payer: 59

## 2020-04-03 ENCOUNTER — Encounter (HOSPITAL_BASED_OUTPATIENT_CLINIC_OR_DEPARTMENT_OTHER): Payer: Self-pay | Admitting: Emergency Medicine

## 2020-04-03 DIAGNOSIS — R200 Anesthesia of skin: Secondary | ICD-10-CM | POA: Diagnosis not present

## 2020-04-03 DIAGNOSIS — Z79899 Other long term (current) drug therapy: Secondary | ICD-10-CM | POA: Diagnosis not present

## 2020-04-03 DIAGNOSIS — I2089 Other forms of angina pectoris: Secondary | ICD-10-CM

## 2020-04-03 DIAGNOSIS — R079 Chest pain, unspecified: Secondary | ICD-10-CM

## 2020-04-03 DIAGNOSIS — R072 Precordial pain: Secondary | ICD-10-CM | POA: Diagnosis not present

## 2020-04-03 DIAGNOSIS — R0789 Other chest pain: Principal | ICD-10-CM | POA: Insufficient documentation

## 2020-04-03 DIAGNOSIS — I208 Other forms of angina pectoris: Secondary | ICD-10-CM

## 2020-04-03 DIAGNOSIS — J4520 Mild intermittent asthma, uncomplicated: Secondary | ICD-10-CM | POA: Diagnosis not present

## 2020-04-03 DIAGNOSIS — Z20822 Contact with and (suspected) exposure to covid-19: Secondary | ICD-10-CM | POA: Diagnosis not present

## 2020-04-03 DIAGNOSIS — I10 Essential (primary) hypertension: Secondary | ICD-10-CM | POA: Diagnosis not present

## 2020-04-03 HISTORY — DX: Other forms of angina pectoris: I20.89

## 2020-04-03 HISTORY — DX: Chest pain, unspecified: R07.9

## 2020-04-03 LAB — CBC
HCT: 36.6 % (ref 36.0–46.0)
Hemoglobin: 12.2 g/dL (ref 12.0–15.0)
MCH: 30.2 pg (ref 26.0–34.0)
MCHC: 33.3 g/dL (ref 30.0–36.0)
MCV: 90.6 fL (ref 80.0–100.0)
Platelets: 233 10*3/uL (ref 150–400)
RBC: 4.04 MIL/uL (ref 3.87–5.11)
RDW: 12.6 % (ref 11.5–15.5)
WBC: 5.8 10*3/uL (ref 4.0–10.5)
nRBC: 0 % (ref 0.0–0.2)

## 2020-04-03 LAB — BASIC METABOLIC PANEL
Anion gap: 9 (ref 5–15)
BUN: 11 mg/dL (ref 6–20)
CO2: 26 mmol/L (ref 22–32)
Calcium: 9.2 mg/dL (ref 8.9–10.3)
Chloride: 104 mmol/L (ref 98–111)
Creatinine, Ser: 0.92 mg/dL (ref 0.44–1.00)
GFR calc Af Amer: >60 mL/min (ref >60)
GFR calc non Af Amer: >60 mL/min (ref >60)
Glucose, Bld: 149 mg/dL — ABNORMAL HIGH (ref 70–99)
Potassium: 4.2 mmol/L (ref 3.5–5.1)
Sodium: 139 mmol/L (ref 135–145)

## 2020-04-03 LAB — TROPONIN I (HIGH SENSITIVITY)
Troponin I (High Sensitivity): 3 ng/L (ref <18)
Troponin I (High Sensitivity): 3 ng/L (ref <18)

## 2020-04-03 LAB — SARS CORONAVIRUS 2 BY RT PCR (HOSPITAL ORDER, PERFORMED IN ~~LOC~~ HOSPITAL LAB): SARS Coronavirus 2: NEGATIVE

## 2020-04-03 MED ORDER — ASPIRIN EC 81 MG PO TBEC
81.0000 mg | DELAYED_RELEASE_TABLET | Freq: Every day | ORAL | Status: DC
  Administered 2020-04-03 – 2020-04-04 (×2): 81 mg via ORAL
  Filled 2020-04-03 (×2): qty 1

## 2020-04-03 MED ORDER — SPIRONOLACTONE 25 MG PO TABS
25.0000 mg | ORAL_TABLET | Freq: Every day | ORAL | Status: DC
  Administered 2020-04-04: 25 mg via ORAL
  Filled 2020-04-03: qty 1

## 2020-04-03 MED ORDER — CYCLOBENZAPRINE HCL 10 MG PO TABS: 10.0000 mg | ORAL_TABLET | Freq: Two times a day (BID) | ORAL | Status: DC | PRN

## 2020-04-03 MED ORDER — ENOXAPARIN SODIUM 40 MG/0.4ML ~~LOC~~ SOLN
40.0000 mg | SUBCUTANEOUS | Status: DC
  Administered 2020-04-03: 40 mg via SUBCUTANEOUS
  Filled 2020-04-03: qty 0.4

## 2020-04-03 MED ORDER — METOPROLOL SUCCINATE ER 25 MG PO TB24
25.0000 mg | ORAL_TABLET | Freq: Every morning | ORAL | Status: DC
  Administered 2020-04-04: 25 mg via ORAL
  Filled 2020-04-03: qty 1

## 2020-04-03 MED ORDER — ALBUTEROL SULFATE (2.5 MG/3ML) 0.083% IN NEBU: 2.5000 mg | INHALATION_SOLUTION | Freq: Four times a day (QID) | RESPIRATORY_TRACT | Status: DC | PRN

## 2020-04-03 MED ORDER — ACETAMINOPHEN 325 MG PO TABS
650.0000 mg | ORAL_TABLET | ORAL | Status: DC | PRN
  Administered 2020-04-03 – 2020-04-04 (×3): 650 mg via ORAL
  Filled 2020-04-03 (×3): qty 2

## 2020-04-03 MED ORDER — ONDANSETRON HCL 4 MG/2ML IJ SOLN: 4.0000 mg | Freq: Four times a day (QID) | INTRAMUSCULAR | Status: DC | PRN

## 2020-04-03 MED ORDER — ATORVASTATIN CALCIUM 40 MG PO TABS
40.0000 mg | ORAL_TABLET | Freq: Every day | ORAL | Status: DC
  Administered 2020-04-03: 40 mg via ORAL
  Filled 2020-04-03: qty 1

## 2020-04-03 MED ORDER — ORPHENADRINE CITRATE ER 100 MG PO TB12
100.0000 mg | ORAL_TABLET | Freq: Two times a day (BID) | ORAL | Status: DC
  Filled 2020-04-03 (×3): qty 1

## 2020-04-03 NOTE — ED Triage Notes (Signed)
Chest pain and L arm pain x 3 weeks. Worse at night.

## 2020-04-03 NOTE — Progress Notes (Signed)
Dr. Lovena Le requested [facility] for this patient tomorrow- spoke with nuc med tech on call who acknowledged request. Orders written, NPO after midnight.

## 2020-04-03 NOTE — ED Notes (Signed)
Pharmacy and medications updated with patient 

## 2020-04-03 NOTE — ED Provider Notes (Addendum)
[facility] Provider Note   CSN: [id number] Arrival date & time: 04/03/20  4650     History Chief Complaint  Patient presents with  . Chest Pain    Den[facility] Me[facility]ia is a 59 y.o. female.  Patient with history of htn, high cholesterol, depression, and GERD presenting for left chest and shoulder pain. She reports that she has had 2 weeks of left shoulder pain that radiates down to her hand and she cannot sleep on her left side at night due to the pain. She reports the chest pain is left sided and radiates to her armpit and down her anterior arm to her forearm and hand. She says that she has had chronic numbness/tingling of left had that she has been taking gabapentin for "for years" per pt.  She reports the hand numbness and pain is not new. She says that the pain only happens at night time when she is going to bed. She denies presence of pain during the day. She says that the pain will begin when she is sitting up in bed and not lying on her left side. She says that when the pain comes on at night, it is constant until the morning when it stops. She describes the chest pain as a "pressure like something is pressing on my chest" and the arm pain as "numb and weak". She denies orthopnea and cough. Admits to an episode of nausea where she didn't have an appetite for her food a few days ago and hasn't recurred since. She denies fever, vomiting, abdominal pain, diarrhea, constipation.  No significant diaphoresis.  She does not develop symptoms with exertion.  She states that she had a stress test years ago.  No new lower extremity swelling or difficulty breathing.  She does not smoke.  No first-degree relatives with history of coronary artery disease, especially younger than age 25.      HPI: A 59 year old patient with a history of hypertension presents for evaluation of chest pain. Initial onset of pain was more than 6 hours ago. The patient's chest pain is well-localized,  is described as heaviness/pressure/tightness and is not worse with exertion. The patient's chest pain is middle- or left-sided, is not sharp and does radiate to the arms/jaw/neck. The patient does not complain of nausea and denies diaphoresis. The patient has no history of stroke, has no history of peripheral artery disease, has not smoked in the past 90 days, denies any history of treated diabetes, has no relevant family history of coronary artery disease (first degree relative at less than age 34), has no history of hypercholesterolemia and does not have an elevated BMI (>=30).   Past Medical History:  Diagnosis Date  . Bronchitis    reports hx of frequent  . Depression    pt states she does not have history of depresssion 02-18-2014  . Fibroid   . GERD (gastroesophageal reflux disease)   . Hypertension     Patient Active Problem List   Diagnosis Date Noted  . Essential hypertension 05/14/2013  . Murmur 05/14/2013  . Bronchitis   . Fibroid   . Depression     Past Surgical History:  Procedure Laterality Date  . ABDOMINAL HYSTERECTOMY    . CARPAL TUNNEL RELEASE     bilateral  . INDUCED ABORTION     X2     OB History    Gravida  3   Para  1   Term  1   Preterm  0  AB  2   Living  1     SAB  0   TAB  2   Ectopic  0   Multiple  0   Live Births  1           Family History  Problem Relation Age of Onset  . Stroke Mother     Social History   Tobacco Use  . Smoking status: Never Smoker  . Smokeless tobacco: Never Used  Substance Use Topics  . Alcohol use: No  . Drug use: No    Home Medications Prior to Admission medications   Medication Sig Start Date End Date Taking? Authorizing Provider  albuterol (PROVENTIL HFA;VENTOLIN HFA) 108 (90 Base) MCG/ACT inhaler Inhale 2 puffs into the lungs 4 (four) times daily as needed. 02/18/14   [provider]  cyclobenzaprine (FLEXERIL) 10 MG tablet Take 1 tablet (10 mg total) by mouth 2 (two) times  daily as needed for muscle spasms. 02/07/19   [name], MD  ibuprofen (ADVIL,MOTRIN) 600 MG tablet Take 1 tablet (600 mg total) by mouth every 6 (six) hours as needed. 07/11/16   Kirichenko, Lahoma Rocker, PA-C  methylPREDNISolone (MEDROL DOSEPAK) 4 MG TBPK tablet Per Dosepak instructions 12/13/17   Charlesetta Shanks, MD  metoprolol succinate (TOPROL-XL) 25 MG 24 hr tablet Take 1 tablet by mouth every morning. 10/11/17   [provider]  orphenadrine (NORFLEX) 100 MG tablet Take 1 tablet (100 mg total) by mouth 2 (two) times daily. 12/13/17   Charlesetta Shanks, MD  spironolactone (ALDACTONE) 25 MG tablet Take 1 tablet by mouth daily. 10/11/17   [provider]    Allergies    Lisinopril  Review of Systems   Review of Systems  Constitutional: Negative for diaphoresis and fever.  Eyes: Negative for redness.  Respiratory: Negative for cough and shortness of breath.   Cardiovascular: Positive for chest pain. Negative for palpitations and leg swelling.  Gastrointestinal: Negative for abdominal pain, nausea and vomiting.  Genitourinary: Negative for dysuria.  Musculoskeletal: Negative for back pain and neck pain.  Skin: Negative for rash.  Neurological: Positive for numbness. Negative for syncope and light-headedness.  Psychiatric/Behavioral: The patient is not nervous/anxious.     Physical Exam Updated Vital Signs BP (!) 147/68   Pulse 64   Temp 99.3 F (37.4 C) (Oral)   Resp 16   Ht 6\' 1"  (1.854 m)   Wt (!) 102.1 kg   SpO2 100%   BMI 29.69 kg/m   Physical Exam Vitals and nursing note reviewed.  Constitutional:      Appearance: She is well-developed. She is not diaphoretic.  HENT:     Head: Normocephalic and atraumatic.     Mouth/Throat:     Mouth: Mucous membranes are not dry.  Eyes:     Conjunctiva/sclera: Conjunctivae normal.  Neck:     Vascular: Normal carotid pulses. No carotid bruit or JVD.     Trachea: Trachea normal. No tracheal deviation.  Cardiovascular:      Rate and Rhythm: Normal rate and regular rhythm.     Pulses: No decreased pulses.          Radial pulses are 2+ on the right side and 2+ on the left side.     Heart sounds: Normal heart sounds, S1 normal and S2 normal. No murmur heard.   Pulmonary:     Effort: Pulmonary effort is normal. No respiratory distress.     Breath sounds: No wheezing.  Chest:  Chest wall: Tenderness present.     Comments: Palpation over the left lateral chest wall reproduces the symptoms somewhat.  Patient also winces and holds the area when she raises her left arm above shoulder height. Abdominal:     General: Bowel sounds are normal.     Palpations: Abdomen is soft.     Tenderness: There is no abdominal tenderness. There is no guarding or rebound.  Musculoskeletal:        General: Normal range of motion.     Cervical back: Normal range of motion and neck supple. No muscular tenderness.  Skin:    General: Skin is warm and dry.     Coloration: Skin is not pale.  Neurological:     Mental Status: She is alert.     ED Results / Procedures / Treatments   Labs (all labs ordered are listed, but only abnormal results are displayed) Labs Reviewed  BASIC METABOLIC PANEL - Abnormal; Notable for the following components:      Result Value   Glucose, Bld 149 (*)    All other components within normal limits  SARS CORONAVIRUS 2 BY RT PCR (HOSPITAL ORDER, [facility])  CBC  TROPONIN I (HIGH SENSITIVITY)  TROPONIN I (HIGH SENSITIVITY)    EKG EKG Interpretation  Date/Time:  Saturday April 03 2020 11:21:40 EDT Ventricular Rate:  60 PR Interval:  156 QRS Duration: 92 QT Interval:  446 QTC Calculation: 446 R Axis:   11 Text Interpretation: Sinus rhythm Left ventricular hypertrophy nonspecific flattening. no STEMI Confirmed by Charlesetta Shanks [phone]) on 04/03/2020 1:31:46 PM   Radiology DG Chest 2 View  Result Date: 04/03/2020 CLINICAL DATA:  Left arm and chest pain x 3 weeks; pt  was here 2 weeks ago but left due to long wait; nonsmoker; no h/o heart problems; no SOB EXAM: CHEST - 2 VIEW COMPARISON:  03/22/2020 FINDINGS: Cardiac silhouette is top-normal in size. No mediastinal or hilar masses or evidence of adenopathy Clear lungs.  Previously seen lung base opacities have resolved. No pleural effusion or pneumothorax. Skeletal structures are unremarkable. IMPRESSION: No active cardiopulmonary disease. Electronically Signed   By: Lajean Manes M.D.   On: 04/03/2020 09:59    Procedures Procedures (including critical care time)  Medications Ordered in ED Medications - No data to display  ED Course  I have reviewed the triage vital signs and the nursing notes.  Pertinent labs & imaging results that were available during my care of the patient were reviewed by me and considered in my medical decision making (see chart for details).  Patient seen and examined. EKG reviewed. Inferiolateral t-wave inversions noted, but baseline is poor.  Requested repeat EKG.  Vital signs reviewed and are as follows: BP 127/76   Pulse 64   Temp 99.3 F (37.4 C) (Oral)   Resp 16   Ht 6\' 1"  (1.854 m)   Wt (!) 102.1 kg   SpO2 100%   BMI 29.69 kg/m   Repeat EKG shows flattened inferior lateral T waves, similar to previous.  Awaiting second troponin.  Patient discussed with Dr. Johnney Killian.  1:42 PM I went to discuss results with patient, after her second troponin returned.  She is frustrated about having to wait and states that she wants to go home so she can rest.  I apologized about the wait times.  Discussed that her troponins are reassuring.  Discussed that she does have risk factors for cardiac disease however and EKG is abnormal,  does not definitively show a heart attack.  Offered referral to cardiology and she states that she has a cardiologist.  She seems to be willing to follow-up with them.  Patient is anxious to leave.  Patient was counseled to return with severe chest pain,  especially if the pain is crushing or pressure-like and spreads to the arms, back, neck, or jaw, or if they have sweating, nausea, or shortness of breath with the pain. They were encouraged to call 911 with these symptoms.   They were also told to return if their chest pain gets worse and does not go away with rest, they have an attack of chest pain lasting longer than usual despite rest and treatment with the medications their caregiver has prescribed, if they wake from sleep with chest pain or shortness of breath, if they feel dizzy or faint, if they have chest pain not typical of their usual pain, or if they have any other emergent concerns regarding their health.  The patient verbalized understanding and agreed.   1:47 PM Dr. Johnney Killian went and spoke to the patient after me. Pt would now like to be admitted. Will discuss with hospitalist.    2:23 PM Dr. Roosevelt Locks accepting.     MDM Rules/Calculators/A&P HEAR Score: 4                        Patient with atypical left-sided chest pain with some reproducibility here.  She does have some risk factors.  Heart score equals 4.  Troponin profile is reassuring.  Recommendation from heart score is observation versus close follow-up.  Patient is anxious to leave the emergency department today and would not be willing to consider admission.  I feel that close follow-up with PCP and/or cardiology is very reasonable.  She may benefit from additional risk stratification with provocative stress test.  Return instructions discussed as above.  Final Clinical Impression(s) / ED Diagnoses Final diagnoses:  Precordial pain    Rx / DC Orders ED Discharge Orders    None        Carlisle Cater, PA-C 04/03/20 [place], MD 04/07/20 [phone]

## 2020-04-03 NOTE — ED Notes (Signed)
Pt refused to allow swab to be advanced into nares

## 2020-04-03 NOTE — H&P (Signed)
History and Physical    Denise Mejia:[id number] DOB: Mar 11, 1961 DOA: 04/03/2020  PCP: Patient, No Pcp Per (Confirm with patient/family/NH records and if not entered, this has to be entered at [facility] point of entry) Patient coming from: Home  I have personally briefly reviewed patient's old medical records in [place]  Chief Complaint: Chest pain  HPI: Denise Mejia is a 59 y.o. female with medical history significant of HTN, recently diagnosed OSA with nocturnal hypoxia, GERD, presented with recurrent nocturnal chest pains. First episode started about 2 weeks ago, woke her at night, pain was pressure like on the left chest field radiated to the left shoulder and upper arm, associated with shortness of breath, palpitation, sweating and feeling of nausea.  She had to sit up and let episode pass.  There seemed no significant alleviating were exacerbating factors.  Over the following nights, she continued to have similar episodes with similar associated symptoms and she never took any medication for the pain and on most occasions she just had to sit up to let the episode pass.  Denies any cough, no fever chills, no abdominal pain.  She has had heavy snoring at night, went to have a sleep study done 7 days ago showed her oxygen level dropped to as low as 83% when she sleeps. ED Course: EKG done showed ST inversion on inferior and lateral leads which disappeared in the repeat after 2 hours.  Troponin 3>3  Review of Systems: As per HPI otherwise 10 point review of systems negative.    Past Medical History:  Diagnosis Date  . Bronchitis    reports hx of frequent  . Depression    pt states she does not have history of depresssion 02-18-2014  . Fibroid   . GERD (gastroesophageal reflux disease)   . Hypertension     Past Surgical History:  Procedure Laterality Date  . ABDOMINAL HYSTERECTOMY    . CARPAL TUNNEL RELEASE     bilateral  . INDUCED ABORTION     X2     reports that she has  never smoked. She has never used smokeless tobacco. She reports that she does not drink alcohol and does not use drugs.  Allergies  Allergen Reactions  . Lisinopril Cough and Other (See Comments)    cough cough cough     Family History  Problem Relation Age of Onset  . Stroke Mother      Prior to Admission medications   Medication Sig Start Date End Date Taking? Authorizing Provider  albuterol (PROVENTIL HFA;VENTOLIN HFA) 108 (90 Base) MCG/ACT inhaler Inhale 2 puffs into the lungs 4 (four) times daily as needed for wheezing or shortness of breath.  02/18/14  Yes [provider]  amLODipine (NORVASC) 10 MG tablet Take 10 mg by mouth daily. 04/02/20  Yes [provider]  atorvastatin (LIPITOR) 20 MG tablet Take 20 mg by mouth daily. 02/16/20  Yes [provider]  gabapentin (NEURONTIN) 300 MG capsule Take 300 mg by mouth in the morning and at bedtime. 01/14/20  Yes [provider]  metoprolol succinate (TOPROL-XL) 25 MG 24 hr tablet Take 1 tablet by mouth every morning. 10/11/17  Yes [provider]  orphenadrine (NORFLEX) 100 MG tablet Take 1 tablet (100 mg total) by mouth 2 (two) times daily. 12/13/17  Yes Charlesetta Shanks, MD  spironolactone (ALDACTONE) 25 MG tablet Take 1 tablet by mouth daily. 10/11/17  Yes [provider]  traZODone (DESYREL) 50 MG tablet Take 25 mg  by mouth daily.  01/20/20  Yes [provider]  Vitamin D, Ergocalciferol, (DRISDOL) 1.25 MG (50000 UNIT) CAPS capsule Take 50,000 Units by mouth every 7 (seven) days.   Yes [provider]  cyclobenzaprine (FLEXERIL) 10 MG tablet Take 1 tablet (10 mg total) by mouth 2 (two) times daily as needed for muscle spasms. Patient not taking: Reported on 04/03/2020 02/07/19   [name], MD  ibuprofen (ADVIL,MOTRIN) 600 MG tablet Take 1 tablet (600 mg total) by mouth every 6 (six) hours as needed. Patient not taking: Reported on 04/03/2020 07/11/16   Jeannett Senior,  PA-C  methylPREDNISolone (MEDROL DOSEPAK) 4 MG TBPK tablet Per Dosepak instructions Patient not taking: Reported on 04/03/2020 12/13/17   Charlesetta Shanks, MD    Physical Exam: Vitals:   04/03/20 1339 04/03/20 1344 04/03/20 1502 04/03/20 1611  BP: 127/76 (!) 159/77 (!) 181/84 (!) 153/75  Pulse:  60 68 77  Resp:  18 18   Temp:   98.2 F (36.8 C) 98.9 F (37.2 C)  TempSrc:   Oral Oral  SpO2:  100% 100% 100%  Weight:    (!) 104.5 kg  Height:    6\' 1"  (1.854 m)    Constitutional: NAD, calm, comfortable Vitals:   04/03/20 1339 04/03/20 1344 04/03/20 1502 04/03/20 1611  BP: 127/76 (!) 159/77 (!) 181/84 (!) 153/75  Pulse:  60 68 77  Resp:  18 18   Temp:   98.2 F (36.8 C) 98.9 F (37.2 C)  TempSrc:   Oral Oral  SpO2:  100% 100% 100%  Weight:    (!) 104.5 kg  Height:    6\' 1"  (1.854 m)   Eyes: PERRL, lids and conjunctivae normal ENMT: Mucous membranes are moist. Posterior pharynx clear of any exudate or lesions.Normal dentition.  Neck: normal, supple, no masses, no thyromegaly Respiratory: clear to auscultation bilaterally, no wheezing, no crackles. Normal respiratory effort. No accessory muscle use.  Cardiovascular: Regular rate and rhythm, no murmurs / rubs / gallops. No extremity edema. 2+ pedal pulses. No carotid bruits.  Abdomen: no tenderness, no masses palpated. No hepatosplenomegaly. Bowel sounds positive.  Musculoskeletal: no clubbing / cyanosis. No joint deformity upper and lower extremities. Good ROM, no contractures. Normal muscle tone.  Skin: no rashes, lesions, ulcers. No induration Neurologic: CN 2-12 grossly intact. Sensation intact, DTR normal. Strength 5/5 in all 4.  Psychiatric: Normal judgment and insight. Alert and oriented x 3. Normal mood.     Labs on Admission: I have personally reviewed following labs and imaging studies  CBC: Recent Labs  Lab 04/03/20 0930  WBC 5.8  HGB 12.2  HCT 36.6  MCV 90.6  PLT 295   Basic Metabolic Panel: Recent Labs    Lab 04/03/20 0930  NA 139  K 4.2  CL 104  CO2 26  GLUCOSE 149*  BUN 11  CREATININE 0.92  CALCIUM 9.2   GFR: Estimated Creatinine Clearance: 92.7 mL/min (by C-G formula based on SCr of 0.92 mg/dL). Liver Function Tests: No results for input(s): AST, ALT, ALKPHOS, BILITOT, PROT, ALBUMIN in the last 168 hours. No results for input(s): LIPASE, AMYLASE in the last 168 hours. No results for input(s): AMMONIA in the last 168 hours. Coagulation Profile: No results for input(s): INR, PROTIME in the last 168 hours. Cardiac Enzymes: No results for input(s): CKTOTAL, CKMB, CKMBINDEX, TROPONINI in the last 168 hours. BNP (last 3 results) No results for input(s): PROBNP in the last 8760 hours. HbA1C: No results for input(s): HGBA1C in  the last 72 hours. CBG: No results for input(s): GLUCAP in the last 168 hours. Lipid Profile: No results for input(s): CHOL, HDL, LDLCALC, TRIG, CHOLHDL, LDLDIRECT in the last 72 hours. Thyroid Function Tests: No results for input(s): TSH, T4TOTAL, FREET4, T3FREE, THYROIDAB in the last 72 hours. Anemia Panel: No results for input(s): VITAMINB12, FOLATE, FERRITIN, TIBC, IRON, RETICCTPCT in the last 72 hours. Urine analysis:    Component Value Date/Time   COLORURINE YELLOW 04/24/2018 [place] 04/24/2018 1240   LABSPEC 1.025 04/24/2018 1240   PHURINE 6.0 04/24/2018 1240   GLUCOSEU NEGATIVE 04/24/2018 1240   HGBUR TRACE (A) 04/24/2018 1240   BILIRUBINUR NEGATIVE 04/24/2018 1240   KETONESUR 15 (A) 04/24/2018 1240   PROTEINUR NEGATIVE 04/24/2018 1240   UROBILINOGEN 1.0 02/10/2015 1700   NITRITE NEGATIVE 04/24/2018 1240   LEUKOCYTESUR NEGATIVE 04/24/2018 1240    Radiological Exams on Admission: DG Chest 2 View  Result Date: 04/03/2020 CLINICAL DATA:  Left arm and chest pain x 3 weeks; pt was here 2 weeks ago but left due to long wait; nonsmoker; no h/o heart problems; no SOB EXAM: CHEST - 2 VIEW COMPARISON:  03/22/2020 FINDINGS:  Cardiac silhouette is top-normal in size. No mediastinal or hilar masses or evidence of adenopathy Clear lungs.  Previously seen lung base opacities have resolved. No pleural effusion or pneumothorax. Skeletal structures are unremarkable. IMPRESSION: No active cardiopulmonary disease. Electronically Signed   By: Lajean Manes M.D.   On: 04/03/2020 09:59    EKG: Independently reviewed.  Morning EKG showed T wave inversion on II, III, aVF and V4 through V6 which improved in 2 hours.  Assessment/Plan Active Problems:   Angina at rest [facility])   Chest pain  (please populate well all problems here in Problem List. (For example, if patient is on BP meds at home and you resume or decide to hold them, it is a problem that needs to be her. Same for CAD, COPD, HLD and so on)  Angina at rest -Given the finding on sleep study of hypoxemia at night strongly suspect this is angina.  Discussed with on-call cardiologist who plan for a nuclear stress test -Start aspirin and check lipid panel and start statin  OSA -Start trial of CPAP at bedtime  HTN -Fairly controlled, continue home regimen.  Mild intermittent asthma -No symptoms or signs of acute exacerbation  DVT prophylaxis: Lovenox Code Status: Full code Family Communication: None at bedside Disposition Plan: Likely can go home within 24 hours after stress test done Consults called: Cardiology Admission status: Telemetry observation   Lequita Halt MD [facility] Pager [phone]  04/03/2020, 6:50 PM

## 2020-04-03 NOTE — Consult Note (Signed)
Cardiology Consultation:   Patient ID: Denise Mejia MRN: [id number]; DOB: 12-22-1960  Admit date: 04/03/2020 Date of Consult: 04/03/2020  Primary Care Provider: Patient, No Pcp Per [place] Cardiologist: No primary care provider on file. none [facility] Electrophysiologist:  None none   Patient Profile:   Denise Mejia is a 59 y.o. female with a hx of HTN and dyslipidemia who is being seen today for the evaluation of atypical chest pain at the request of Dr. Roosevelt Locks.  History of Present Illness:   Denise Mejia was seen in the ED 1.5 weeks ago with similar symptoms. She was discharged home. She has been experiencing left arm pain and numbness and mid sternal, non-exertional chest pain. Radiates to the shoulder. She also notes some arm weakness. No fever or chills. She denies tobacco abuse. No sob. Pain is better.   Past Medical History:  Diagnosis Date  . Bronchitis    reports hx of frequent  . Depression    pt states she does not have history of depresssion 02-18-2014  . Fibroid   . GERD (gastroesophageal reflux disease)   . Hypertension     Past Surgical History:  Procedure Laterality Date  . ABDOMINAL HYSTERECTOMY    . CARPAL TUNNEL RELEASE     bilateral  . INDUCED ABORTION     X2       Inpatient Medications: Scheduled Meds: . aspirin EC  81 mg Oral Daily  . atorvastatin  40 mg Oral Daily  . enoxaparin (LOVENOX) injection  40 mg Subcutaneous Q24H  . [START ON 04/04/2020] metoprolol succinate  25 mg Oral q morning - 10a  . orphenadrine  100 mg Oral BID  . spironolactone  25 mg Oral Daily   Continuous Infusions:  PRN Meds: acetaminophen, albuterol, cyclobenzaprine, ondansetron (ZOFRAN) IV  Allergies:    Allergies  Allergen Reactions  . Lisinopril Cough and Other (See Comments)    cough cough cough     Social History:   Social History   Socioeconomic History  . Marital status: Single    Spouse name: Not on file  . Number of children: 1  . Years  of education: Not on file  . Highest education level: Not on file  Occupational History    Comment: Nurses aide  Tobacco Use  . Smoking status: Never Smoker  . Smokeless tobacco: Never Used  Substance and Sexual Activity  . Alcohol use: No  . Drug use: No  . Sexual activity: Not on file  Other Topics Concern  . Not on file  Social History Narrative  . Not on file   Social Determinants of Health   Financial Resource Strain:   . Difficulty of Paying Living Expenses:   Food Insecurity:   . Worried About Charity fundraiser in the Last Year:   . Arboriculturist in the Last Year:   Transportation Needs:   . Film/video editor (Medical):   Marland Kitchen Lack of Transportation (Non-Medical):   Physical Activity:   . Days of Exercise per Week:   . Minutes of Exercise per Session:   Stress:   . Feeling of Stress :   Social Connections:   . Frequency of Communication with Friends and Family:   . Frequency of Social Gatherings with Friends and Family:   . Attends Religious Services:   . Active Member of Clubs or Organizations:   . Attends Archivist Meetings:   Marland Kitchen Marital Status:   Intimate Partner Violence:   .  Fear of Current or Ex-Partner:   . Emotionally Abused:   Marland Kitchen Physically Abused:   . Sexually Abused:     Family History:    Family History  Problem Relation Age of Onset  . Stroke Mother      ROS:  Please see the history of present illness.   All other ROS reviewed and negative.     Physical Exam/Data:   Vitals:   04/03/20 1339 04/03/20 1344 04/03/20 1502 04/03/20 1611  BP: 127/76 (!) 159/77 (!) 181/84 (!) 153/75  Pulse:  60 68 77  Resp:  18 18   Temp:   98.2 F (36.8 C) 98.9 F (37.2 C)  TempSrc:   Oral Oral  SpO2:  100% 100% 100%  Weight:    (!) 104.5 kg  Height:    6\' 1"  (1.854 m)   No intake or output data in the 24 hours ending 04/03/20 1704 Last 3 Weights 04/03/2020 04/03/2020 03/22/2020  Weight (lbs) 230 lb 6.4 oz 225 lb 231 lb  Weight (kg)  104.509 kg 102.059 kg 104.781 kg     Body mass index is 30.4 kg/m.  General:  Well nourished, well developed, in no acute distress HEENT: normal Lymph: no adenopathy Neck: no JVD Endocrine:  No thryomegaly Vascular: No carotid bruits; FA pulses 2+ bilaterally without bruits  Cardiac:  normal S1, S2; RRR; no murmur  Lungs:  clear to auscultation bilaterally, no wheezing, rhonchi or rales  Abd: soft, nontender, no hepatomegaly  Ext: no edema Musculoskeletal:  No deformities, BUE and BLE strength normal and equal Skin: warm and dry  Neuro:  CNs 2-12 intact, no focal abnormalities noted Psych:  Normal affect   EKG:  The EKG was personally reviewed and demonstrates:  Unable to pull up Telemetry:  Telemetry was personally reviewed and demonstrates:  nsr  Relevant CV Studies: Negative troponins  Laboratory Data:  High Sensitivity Troponin:   Recent Labs  Lab 04/03/20 0930 04/03/20 1153  TROPONINIHS 3 3     Chemistry Recent Labs  Lab 04/03/20 0930  NA 139  K 4.2  CL 104  CO2 26  GLUCOSE 149*  BUN 11  CREATININE 0.92  CALCIUM 9.2  GFRNONAA >60  GFRAA >60  ANIONGAP 9    No results for input(s): PROT, ALBUMIN, AST, ALT, ALKPHOS, BILITOT in the last 168 hours. Hematology Recent Labs  Lab 04/03/20 0930  WBC 5.8  RBC 4.04  HGB 12.2  HCT 36.6  MCV 90.6  MCH 30.2  MCHC 33.3  RDW 12.6  PLT 233   BNPNo results for input(s): BNP, PROBNP in the last 168 hours.  DDimer No results for input(s): DDIMER in the last 168 hours.   Radiology/Studies:  DG Chest 2 View  Result Date: 04/03/2020 CLINICAL DATA:  Left arm and chest pain x 3 weeks; pt was here 2 weeks ago but left due to long wait; nonsmoker; no h/o heart problems; no SOB EXAM: CHEST - 2 VIEW COMPARISON:  03/22/2020 FINDINGS: Cardiac silhouette is top-normal in size. No mediastinal or hilar masses or evidence of adenopathy Clear lungs.  Previously seen lung base opacities have resolved. No pleural effusion or  pneumothorax. Skeletal structures are unremarkable. IMPRESSION: No active cardiopulmonary disease. Electronically Signed   By: Lajean Manes M.D.   On: 04/03/2020 09:59     Assessment and Plan:   1. Atypical chest pain - she has atypical symptoms and a couple of cardiac risk factors. We will obtain a lexiscan myoview as I  do not think coronary CT available on the weekend.  2. MS pain - some of her arm pain is related to MS pain and can be reproduced with palpitation     For questions or updates, please contact [place] Please consult [email or web address] for contact info under    Signed, Cristopher Peru, MD  04/03/2020 5:04 PM

## 2020-04-03 NOTE — ED Notes (Signed)
EKG done by another tech

## 2020-04-03 NOTE — ED Notes (Signed)
ED Provider Pfeiffer MD at bedside.

## 2020-04-04 ENCOUNTER — Observation Stay (HOSPITAL_BASED_OUTPATIENT_CLINIC_OR_DEPARTMENT_OTHER): Payer: 59

## 2020-04-04 DIAGNOSIS — R079 Chest pain, unspecified: Secondary | ICD-10-CM

## 2020-04-04 DIAGNOSIS — I208 Other forms of angina pectoris: Secondary | ICD-10-CM | POA: Diagnosis not present

## 2020-04-04 LAB — NM MYOCAR MULTI W/SPECT W/WALL MOTION / EF
Peak HR: 94 {beats}/min
Rest HR: 57 {beats}/min

## 2020-04-04 LAB — HIV ANTIBODY (ROUTINE TESTING W REFLEX): HIV Screen 4th Generation wRfx: NONREACTIVE

## 2020-04-04 MED ORDER — REGADENOSON 0.4 MG/5ML IV SOLN
INTRAVENOUS | Status: AC
  Administered 2020-04-04: 0.4 mg via INTRAVENOUS
  Filled 2020-04-04: qty 5

## 2020-04-04 MED ORDER — TECHNETIUM TC 99M TETROFOSMIN IV KIT
11.0000 | PACK | Freq: Once | INTRAVENOUS | Status: AC | PRN
  Administered 2020-04-04: 11 via INTRAVENOUS

## 2020-04-04 MED ORDER — ASPIRIN 81 MG PO TBEC: 81.0000 mg | DELAYED_RELEASE_TABLET | Freq: Every day | ORAL | 11 refills | Status: DC

## 2020-04-04 MED ORDER — NITROGLYCERIN 0.4 MG SL SUBL
0.4000 mg | SUBLINGUAL_TABLET | SUBLINGUAL | 0 refills | Status: DC | PRN
Start: 2020-04-04 — End: 2022-04-14

## 2020-04-04 MED ORDER — REGADENOSON 0.4 MG/5ML IV SOLN
0.4000 mg | Freq: Once | INTRAVENOUS | Status: AC
  Filled 2020-04-04: qty 5

## 2020-04-04 MED ORDER — TECHNETIUM TC 99M TETROFOSMIN IV KIT
28.5000 | PACK | Freq: Once | INTRAVENOUS | Status: AC | PRN
  Administered 2020-04-04: 28.5 via INTRAVENOUS

## 2020-04-04 NOTE — Discharge Summary (Signed)
Physician Discharge Summary  Maya Arcand RSW:[id number] DOB: Apr 20, 1961 DOA: 04/03/2020  PCP: Patient, No Pcp Per  Admit date: 04/03/2020 Discharge date: 04/04/2020  Admitted From: Home Disposition: Home  Recommendations for Outpatient Follow-up:  1. Follow up with PCP in 1 week with repeat CBC/BMP 2. Outpatient follow-up with cardiology if needed 3. Follow up in ED if symptoms worsen or new appear   Home Health: No Equipment/Devices: None  Discharge Condition: Stable CODE STATUS: Full Diet recommendation: Heart healthy  Brief/Interim Summary: 59 y.o. female with medical history significant of HTN, recently diagnosed OSA with nocturnal hypoxia, GERD, presented with recurrent nocturnal chest pains. Presentation, EKG showed ST inversion on inferior lateral leads which disappeared in the repeat after 2 hours. No significant troponin elevation. Cardiology was consulted. She underwent stress test today which was negative. She will be discharged home today.  Discharge Diagnoses:   Chest pain -EKG showed ST inversion on inferior lateral leads which disappeared in the repeat after 2 hours. No significant troponin elevation. Cardiology was consulted. She underwent stress test today  which was negative. She will be discharged home; cleared by cardiology for discharge.  OSA -will need to follow-up with PCP regarding CPAP set up at home.  Hypertension -Continue home regimen. Outpatient follow-up  Mild intermittent asthma -No signs of exacerbation  Discharge Instructions    Allergies as of 04/04/2020      Reactions   Lisinopril Cough, Other (See Comments)   cough cough cough      Medication List    STOP taking these medications   cyclobenzaprine 10 MG tablet Commonly known as: FLEXERIL   ibuprofen 600 MG tablet Commonly known as: ADVIL   methylPREDNISolone 4 MG Tbpk tablet Commonly known as: MEDROL DOSEPAK     TAKE these medications   albuterol 108 (90 Base) MCG/ACT  inhaler Commonly known as: VENTOLIN HFA Inhale 2 puffs into the lungs 4 (four) times daily as needed for wheezing or shortness of breath.   amLODipine 10 MG tablet Commonly known as: NORVASC Take 10 mg by mouth daily.   aspirin 81 MG EC tablet Take 1 tablet (81 mg total) by mouth daily. Swallow whole.   atorvastatin 20 MG tablet Commonly known as: LIPITOR Take 20 mg by mouth daily.   gabapentin 300 MG capsule Commonly known as: NEURONTIN Take 300 mg by mouth in the morning and at bedtime.   metoprolol succinate 25 MG 24 hr tablet Commonly known as: TOPROL-XL Take 1 tablet by mouth every morning.   nitroGLYCERIN 0.4 MG SL tablet Commonly known as: Nitrostat Place 1 tablet (0.4 mg total) under the tongue every 5 (five) minutes as needed for chest pain.   orphenadrine 100 MG tablet Commonly known as: NORFLEX Take 1 tablet (100 mg total) by mouth 2 (two) times daily.   spironolactone 25 MG tablet Commonly known as: ALDACTONE Take 1 tablet by mouth daily.   traZODone 50 MG tablet Commonly known as: DESYREL Take 25 mg by mouth daily.   Vitamin D (Ergocalciferol) 1.25 MG (50000 UNIT) Caps capsule Commonly known as: DRISDOL Take 50,000 Units by mouth every 7 (seven) days.         Follow-up Information    PCP. Schedule an appointment as soon as possible for a visit in 1 week(s).              Allergies  Allergen Reactions  . Lisinopril Cough and Other (See Comments)    cough cough cough     Consultations:  Cardiology  Procedures/Studies: DG Chest 2 View  Result Date: 04/03/2020 CLINICAL DATA:  Left arm and chest pain x 3 weeks; pt was here 2 weeks ago but left due to long wait; nonsmoker; no h/o heart problems; no SOB EXAM: CHEST - 2 VIEW COMPARISON:  03/22/2020 FINDINGS: Cardiac silhouette is top-normal in size. No mediastinal or hilar masses or evidence of adenopathy Clear lungs.  Previously seen lung base opacities have resolved. No pleural effusion  or pneumothorax. Skeletal structures are unremarkable. IMPRESSION: No active cardiopulmonary disease. Electronically Signed   By: Lajean Manes M.D.   On: 04/03/2020 09:59   DG Chest 2 View  Result Date: 03/22/2020 CLINICAL DATA:  Chest pain, left arm pain EXAM: CHEST - 2 VIEW COMPARISON:  02/07/2019 FINDINGS: Low lung volumes. Mild patchy density at the lung bases. No pleural effusion or pneumothorax. Cardiomediastinal contours are within normal limits. IMPRESSION: Mild patchy atelectasis/consolidation at the lung bases. Electronically Signed   By: Macy Mis M.D.   On: 03/22/2020 16:27    Stress test: IMPRESSION: 1. No scintigraphic evidence of prior infarction or pharmacologically induced ischemia.  2. Normal left ventricular wall motion.  3. Left ventricular ejection fraction 66%   Subjective: Patient seen and examined at bedside. Denies current chest pain, nausea or vomiting. No worsening shortness of breath.  Discharge Exam: Vitals:   04/04/20 1026 04/04/20 1028  BP: (!) 155/64 (!) 151/62  Pulse:    Resp:    Temp:    SpO2:      General: Pt is alert, awake, not in acute distress Cardiovascular: rate controlled, S1/S2 + Respiratory: bilateral decreased breath sounds at bases with some scattered crackles Abdominal: Soft, NT, ND, bowel sounds + Extremities: Trace lower extremity edema; no cyanosis    The results of significant diagnostics from this hospitalization (including imaging, microbiology, ancillary and laboratory) are listed below for reference.     Microbiology: Recent Results (from the past 240 hour(s))  SARS Coronavirus 2 by RT PCR (hospital order, performed in [facility] hospital lab) Nasopharyngeal Nasopharyngeal Swab     Status: None   Collection Time: 04/03/20  1:58 PM   Specimen: Nasopharyngeal Swab  Result Value Ref Range Status   SARS Coronavirus 2 NEGATIVE NEGATIVE Final    Comment: (NOTE) SARS-CoV-2 target nucleic acids are NOT  DETECTED.  The SARS-CoV-2 RNA is generally detectable in upper and lower respiratory specimens during the acute phase of infection. The lowest concentration of SARS-CoV-2 viral copies this assay can detect is 250 copies / mL. A negative result does not preclude SARS-CoV-2 infection and should not be used as the sole basis for treatment or other patient management decisions.  A negative result may occur with improper specimen collection / handling, submission of specimen other than nasopharyngeal swab, presence of viral mutation(s) within the areas targeted by this assay, and inadequate number of viral copies (<250 copies / mL). A negative result must be combined with clinical observations, patient history, and epidemiological information.  Fact Sheet for Patients:   StrictlyIdeas.no  Fact Sheet for Healthcare Providers: BankingDealers.co.za  This test is not yet approved or  cleared by the [place] FDA and has been authorized for detection and/or diagnosis of SARS-CoV-2 by FDA under an Emergency Use Authorization (EUA).  This EUA will remain in effect (meaning this test can be used) for the duration of the COVID-19 declaration under Section 564(b)(1) of the Act, 21 U.S.C. section 360bbb-3(b)(1), unless the authorization is terminated or revoked sooner.  Performed at [facility]  [employer], [address] [place], [place] [address]      Labs: BNP (last 3 results) No results for input(s): BNP in the last 8760 hours. Basic Metabolic Panel: Recent Labs  Lab 04/03/20 0930  NA 139  K 4.2  CL 104  CO2 26  GLUCOSE 149*  BUN 11  CREATININE 0.92  CALCIUM 9.2   Liver Function Tests: No results for input(s): AST, ALT, ALKPHOS, BILITOT, PROT, ALBUMIN in the last 168 hours. No results for input(s): LIPASE, AMYLASE in the last 168 hours. No results for input(s): AMMONIA in the last 168 hours. CBC: Recent Labs  Lab  04/03/20 0930  WBC 5.8  HGB 12.2  HCT 36.6  MCV 90.6  PLT 233   Cardiac Enzymes: No results for input(s): CKTOTAL, CKMB, CKMBINDEX, TROPONINI in the last 168 hours. BNP: Invalid input(s): POCBNP CBG: No results for input(s): GLUCAP in the last 168 hours. D-Dimer No results for input(s): DDIMER in the last 72 hours. Hgb A1c No results for input(s): HGBA1C in the last 72 hours. Lipid Profile No results for input(s): CHOL, HDL, LDLCALC, TRIG, CHOLHDL, LDLDIRECT in the last 72 hours. Thyroid function studies No results for input(s): TSH, T4TOTAL, T3FREE, THYROIDAB in the last 72 hours.  Invalid input(s): FREET3 Anemia work up No results for input(s): VITAMINB12, FOLATE, FERRITIN, TIBC, IRON, RETICCTPCT in the last 72 hours. Urinalysis    Component Value Date/Time   COLORURINE YELLOW 04/24/2018 [place] 04/24/2018 1240   LABSPEC 1.025 04/24/2018 1240   PHURINE 6.0 04/24/2018 1240   GLUCOSEU NEGATIVE 04/24/2018 1240   HGBUR TRACE (A) 04/24/2018 1240   BILIRUBINUR NEGATIVE 04/24/2018 1240   KETONESUR 15 (A) 04/24/2018 1240   PROTEINUR NEGATIVE 04/24/2018 1240   UROBILINOGEN 1.0 02/10/2015 1700   NITRITE NEGATIVE 04/24/2018 1240   LEUKOCYTESUR NEGATIVE 04/24/2018 1240   Sepsis Labs Invalid input(s): PROCALCITONIN,  WBC,  LACTICIDVEN Microbiology Recent Results (from the past 240 hour(s))  SARS Coronavirus 2 by RT PCR (hospital order, performed in [facility] hospital lab) Nasopharyngeal Nasopharyngeal Swab     Status: None   Collection Time: 04/03/20  1:58 PM   Specimen: Nasopharyngeal Swab  Result Value Ref Range Status   SARS Coronavirus 2 NEGATIVE NEGATIVE Final    Comment: (NOTE) SARS-CoV-2 target nucleic acids are NOT DETECTED.  The SARS-CoV-2 RNA is generally detectable in upper and lower respiratory specimens during the acute phase of infection. The lowest concentration of SARS-CoV-2 viral copies this assay can detect is 250 copies / mL. A  negative result does not preclude SARS-CoV-2 infection and should not be used as the sole basis for treatment or other patient management decisions.  A negative result may occur with improper specimen collection / handling, submission of specimen other than nasopharyngeal swab, presence of viral mutation(s) within the areas targeted by this assay, and inadequate number of viral copies (<250 copies / mL). A negative result must be combined with clinical observations, patient history, and epidemiological information.  Fact Sheet for Patients:   StrictlyIdeas.no  Fact Sheet for Healthcare Providers: BankingDealers.co.za  This test is not yet approved or  cleared by the [place] FDA and has been authorized for detection and/or diagnosis of SARS-CoV-2 by FDA under an Emergency Use Authorization (EUA).  This EUA will remain in effect (meaning this test can be used) for the duration of the COVID-19 declaration under Section 564(b)(1) of the Act, 21 U.S.C. section 360bbb-3(b)(1), unless the authorization is terminated or revoked sooner.  Performed at [facility], [address] [place], [place] [address]      Time coordinating discharge: 35 minutes  SIGNED:   Aline August, MD  [facility] 04/04/2020, 11:27 AM

## 2020-04-04 NOTE — Progress Notes (Addendum)
Progress Note  Patient Name: Denise Mejia Date of Encounter: 04/04/2020  Primary Cardiologist: No primary care provider on file. - new this admission, pending MD to see (was originally seen by Dr. Lovena Le but he is EP)  Subjective   Overall feeling better. Chest pressure improved. Seen down in nuc med.  Inpatient Medications    Scheduled Meds: . aspirin EC  81 mg Oral Daily  . atorvastatin  40 mg Oral Daily  . enoxaparin (LOVENOX) injection  40 mg Subcutaneous Q24H  . metoprolol succinate  25 mg Oral q morning - 10a  . orphenadrine  100 mg Oral BID  . spironolactone  25 mg Oral Daily   Continuous Infusions:  PRN Meds: acetaminophen, albuterol, cyclobenzaprine, ondansetron (ZOFRAN) IV   Vital Signs    Vitals:   04/04/20 1024 04/04/20 1026 04/04/20 1028 04/04/20 1141  BP: (!) 139/80 (!) 155/64 (!) 151/62 (!) 139/59  Pulse:    72  Resp:    18  Temp:    98.7 F (37.1 C)  TempSrc:    Oral  SpO2:    100%  Weight:      Height:        Intake/Output Summary (Last 24 hours) at 04/04/2020 1303 Last data filed at 04/04/2020 0656 Gross per 24 hour  Intake 240 ml  Output --  Net 240 ml   Last 3 Weights 04/04/2020 04/03/2020 04/03/2020  Weight (lbs) 230 lb 230 lb 6.4 oz 225 lb  Weight (kg) 104.327 kg 104.509 kg 102.059 kg     Telemetry    NSR - Personally Reviewed  Physical Exam   GEN: No acute distress.  HEENT: Normocephalic, atraumatic, sclera non-icteric. Neck: No JVD or bruits. Cardiac: RRR no murmurs, rubs, or gallops.  Radials/DP/PT 1+ and equal bilaterally.  Respiratory: Clear to auscultation bilaterally. Breathing is unlabored. GI: Soft, nontender, non-distended, BS +x 4. MS: no deformity. Extremities: No clubbing or cyanosis. No edema. Distal pedal pulses are 2+ and equal bilaterally. Neuro:  AAOx3. Follows commands. Psych:  Somewhat flat affect  Labs    High Sensitivity Troponin:   Recent Labs  Lab 04/03/20 0930 04/03/20 1153  TROPONINIHS 3 3       Cardiac EnzymesNo results for input(s): TROPONINI in the last 168 hours. No results for input(s): TROPIPOC in the last 168 hours.   Chemistry Recent Labs  Lab 04/03/20 0930  NA 139  K 4.2  CL 104  CO2 26  GLUCOSE 149*  BUN 11  CREATININE 0.92  CALCIUM 9.2  GFRNONAA >60  GFRAA >60  ANIONGAP 9     Hematology Recent Labs  Lab 04/03/20 0930  WBC 5.8  RBC 4.04  HGB 12.2  HCT 36.6  MCV 90.6  MCH 30.2  MCHC 33.3  RDW 12.6  PLT 233    BNPNo results for input(s): BNP, PROBNP in the last 168 hours.   DDimer No results for input(s): DDIMER in the last 168 hours.   Radiology    DG Chest 2 View  Result Date: 04/03/2020 CLINICAL DATA:  Left arm and chest pain x 3 weeks; pt was here 2 weeks ago but left due to long wait; nonsmoker; no h/o heart problems; no SOB EXAM: CHEST - 2 VIEW COMPARISON:  03/22/2020 FINDINGS: Cardiac silhouette is top-normal in size. No mediastinal or hilar masses or evidence of adenopathy Clear lungs.  Previously seen lung base opacities have resolved. No pleural effusion or pneumothorax. Skeletal structures are unremarkable. IMPRESSION: No active cardiopulmonary disease. Electronically  Signed   By: Lajean Manes M.D.   On: 04/03/2020 09:59   NM Myocar Multi W/Spect W/Wall Motion / EF  Result Date: 04/04/2020 CLINICAL DATA:  Nonspecific chest pain. Shortness of breath on exertion. EXAM: MYOCARDIAL IMAGING WITH SPECT (REST AND PHARMACOLOGIC-STRESS) GATED LEFT VENTRICULAR WALL MOTION STUDY LEFT VENTRICULAR EJECTION FRACTION TECHNIQUE: Standard myocardial SPECT imaging was performed after resting intravenous injection of 10 mCi Tc-61m tetrofosmin. Subsequently, intravenous infusion of Lexiscan was performed under the supervision of the Cardiology staff. At peak effect of the drug, 30 mCi Tc-41m tetrofosmin was injected intravenously and standard myocardial SPECT imaging was performed. Quantitative gated imaging was also performed to evaluate left ventricular  wall motion, and estimate left ventricular ejection fraction. COMPARISON:  Chest radiograph-04/03/2020 FINDINGS: Raw images: There is a minimal amount of GI attenuation, worse on provided stress images. No significant patient motion artifact. No significant breast or chest wall attenuation. Perfusion: There is a minimal amount of attenuation involving the apex of the left ventricle which resolves on the provided stress images and is without associated regional wall motion abnormality. No scintigraphic evidence of infarction or pharmacologically induced ischemia. Wall Motion: Normal left ventricular wall motion. No left ventricular dilation. Left Ventricular Ejection Fraction: 66 % End diastolic volume 017 ml End systolic volume 35 ml IMPRESSION: 1. No scintigraphic evidence of prior infarction or pharmacologically induced ischemia. 2. Normal left ventricular wall motion. 3. Left ventricular ejection fraction 66% Electronically Signed   By: Sandi Mariscal M.D.   On: 04/04/2020 12:20    Cardiac Studies   NST pending  Patient Profile     59 y.o. female with hx of depression, GERD, HTN, dyslipidemia presented with atypical chest pain, ruled out for MI.  Assessment & Plan    1. Atypical chest pain, also some arm pain reproducible with palpation - troponins negative. Not tachycardic, tachypneic or hypoxic. Tele reassuring. For Lexiscan nuc today. Completed without acute complication, result pending.  2. HTN - BP initially elevated, controlled this morning.  3. Hyperglycemia - further management per primary team. CBG 149.  Addendum: NST was normal. Await MD round/input and possible plan for DC. Anticipate f/u with PCP.  For questions or updates, please contact [facility] Please consult [email or web address] for contact info under Cardiology/STEMI.  Signed, Candee Furbish, MD 04/04/2020, 1:03 PM    Personally seen and examined. Agree with above.   Nuclear stress test back and overall low risk with no  evidence of ischemia identified.  She is comfortable.  She did ask why she felt pressure around her chest, could have been GERD or perhaps musculoskeletal but I did reassure her that there was no evidence of acute coronary syndrome.  Troponin was normal.  EKG unremarkable.  GEN: Well nourished, well developed, in no acute distress  HEENT: normal  Neck: no JVD, carotid bruits, or masses Cardiac: RRR; no murmurs, rubs, or gallops,no edema  Respiratory:  clear to auscultation bilaterally, normal work of breathing GI: soft, nontender, nondistended, + BS MS: no deformity or atrophy  Skin: warm and dry, no rash Neuro:  Alert and Oriented x 3, Strength and sensation are intact Psych: euthymic mood, full affect  Assessment and plan:  Atypical chest pain -Reassurance from stress test.  Overall low risk.  Continue with aggressive primary prevention strategies.  Essential hypertension -Continue with overall good blood pressure control.  Minimize future risks.  Hyperlipidemia -On atorvastatin high intensity dose 40 mg.  Excellent.  No need for cardiology follow up  Candee Furbish,  MD   

## 2020-04-04 NOTE — Progress Notes (Signed)
   Denise Mejia presented for a nuclear stress test today.  No immediate complications.  Stress imaging is pending at this time.  Preliminary EKG findings may be listed in the chart, but the stress test result will not be finalized until perfusion imaging is complete.  Charlie Pitter, PA-C 04/04/2020, 10:33 AM

## 2020-04-04 NOTE — Plan of Care (Signed)
  Problem: Clinical Measurements: Goal: Ability to maintain clinical measurements within normal limits will improve Outcome: Progressing   

## 2021-01-16 ENCOUNTER — Other Ambulatory Visit: Payer: Self-pay

## 2021-01-16 ENCOUNTER — Emergency Department (HOSPITAL_BASED_OUTPATIENT_CLINIC_OR_DEPARTMENT_OTHER)
Admission: EM | Admit: 2021-01-16 | Discharge: 2021-01-16 | Disposition: A | Discharge status: Home / Self Care | Payer: 59 | Attending: Emergency Medicine | Admitting: Emergency Medicine

## 2021-01-16 ENCOUNTER — Encounter (HOSPITAL_BASED_OUTPATIENT_CLINIC_OR_DEPARTMENT_OTHER): Payer: Self-pay | Admitting: Emergency Medicine

## 2021-01-16 DIAGNOSIS — K115 Sialolithiasis: Secondary | ICD-10-CM | POA: Diagnosis not present

## 2021-01-16 DIAGNOSIS — Z7982 Long term (current) use of aspirin: Secondary | ICD-10-CM | POA: Insufficient documentation

## 2021-01-16 DIAGNOSIS — Z79899 Other long term (current) drug therapy: Secondary | ICD-10-CM | POA: Diagnosis not present

## 2021-01-16 DIAGNOSIS — I10 Essential (primary) hypertension: Secondary | ICD-10-CM | POA: Insufficient documentation

## 2021-01-16 DIAGNOSIS — R220 Localized swelling, mass and lump, head: Secondary | ICD-10-CM | POA: Diagnosis present

## 2021-01-16 MED ORDER — CEPHALEXIN 500 MG PO CAPS: 500.0000 mg | ORAL_CAPSULE | Freq: Four times a day (QID) | ORAL | 0 refills | Status: AC

## 2021-01-16 NOTE — ED Triage Notes (Signed)
Pt arrives pov with reports of left side facial swelling. Pt denies injury, denies recent dental issue. Pt endorses ibuprofen at 0800 today with mild relief

## 2021-01-16 NOTE — ED Provider Notes (Signed)
[facility] EMERGENCY DEPARTMENT Provider Note   CSN: [id number] Arrival date & time: 01/16/21  8416     History Chief Complaint  Patient presents with  . Facial Swelling    Denise Mejia is a 60 y.o. female.  60 y/o female with no PMH presents to the ED with a chief complaint of right facial swelling sudden onset this morning while waking up.  Patient reports going to bed last night normal, woke up with swelling to the right side of her jaw, states is very painful and tender to touch and exacerbated with movement of her jaw.  She has applied ice, taking ibuprofen without much improvement in her symptoms.  Does report having a tooth extraction 2 years ago, states "I have not been right on that side ", low back for follow-up, took several medications however feels like her right side of her mouth ever returned back to baseline.  She has not had any similar episodes in the past.  He denies any fever, difficulty tolerating her secretions, using her voice, shortness of breath.  No URI symptoms.     The history is provided by the patient.       Past Medical History:  Diagnosis Date  . Bronchitis    reports hx of frequent  . Depression    pt states she does not have history of depresssion 02-18-2014  . Fibroid   . GERD (gastroesophageal reflux disease)   . Hypertension     Patient Active Problem List   Diagnosis Date Noted  . Angina at rest [facility]) 04/03/2020  . Chest pain 04/03/2020  . Essential hypertension 05/14/2013  . Murmur 05/14/2013  . Bronchitis   . Fibroid   . Depression     Past Surgical History:  Procedure Laterality Date  . ABDOMINAL HYSTERECTOMY    . CARPAL TUNNEL RELEASE     bilateral  . INDUCED ABORTION     X2     OB History    Gravida  3   Para  1   Term  1   Preterm  0   AB  2   Living  1     SAB  0   IAB  2   Ectopic  0   Multiple  0   Live Births  1           Family History  Problem Relation Age of Onset  .  Stroke Mother     Social History   Tobacco Use  . Smoking status: Never Smoker  . Smokeless tobacco: Never Used  Substance Use Topics  . Alcohol use: No  . Drug use: No    Home Medications Prior to Admission medications   Medication Sig Start Date End Date Taking? Authorizing Provider  cephALEXin (KEFLEX) 500 MG capsule Take 1 capsule (500 mg total) by mouth every 6 (six) hours for 7 days. 01/16/21 01/23/21 Yes Zakari Couchman, Beverley Fiedler, PA-C  albuterol (PROVENTIL HFA;VENTOLIN HFA) 108 (90 Base) MCG/ACT inhaler Inhale 2 puffs into the lungs 4 (four) times daily as needed for wheezing or shortness of breath.  02/18/14   [provider]  amLODipine (NORVASC) 10 MG tablet Take 10 mg by mouth daily. 04/02/20   [provider]  aspirin EC 81 MG EC tablet Take 1 tablet (81 mg total) by mouth daily. Swallow whole. 04/04/20   Aline August, MD  atorvastatin (LIPITOR) 20 MG tablet Take 20 mg by mouth daily. 02/16/20   [provider]  gabapentin (  NEURONTIN) 300 MG capsule Take 300 mg by mouth in the morning and at bedtime. 01/14/20   [provider]  metoprolol succinate (TOPROL-XL) 25 MG 24 hr tablet Take 1 tablet by mouth every morning. 10/11/17   [provider]  nitroGLYCERIN (NITROSTAT) 0.4 MG SL tablet Place 1 tablet (0.4 mg total) under the tongue every 5 (five) minutes as needed for chest pain. 04/04/20   Aline August, MD  orphenadrine (NORFLEX) 100 MG tablet Take 1 tablet (100 mg total) by mouth 2 (two) times daily. 12/13/17   Charlesetta Shanks, MD  spironolactone (ALDACTONE) 25 MG tablet Take 1 tablet by mouth daily. 10/11/17   [provider]  traZODone (DESYREL) 50 MG tablet Take 25 mg by mouth daily.  01/20/20   [provider]  Vitamin D, Ergocalciferol, (DRISDOL) 1.25 MG (50000 UNIT) CAPS capsule Take 50,000 Units by mouth every 7 (seven) days.    [provider]    Allergies    Lisinopril  Review of Systems   Review of Systems   Constitutional: Negative for fever.  HENT: Positive for facial swelling. Negative for sore throat.   Respiratory: Negative for shortness of breath.     Physical Exam Updated Vital Signs BP 136/65 (BP Location: Right Arm)   Pulse 77   Temp 98.8 F (37.1 C) (Oral)   Resp 18   Ht 6\' 1"  (1.854 m)   Wt 102.1 kg   SpO2 98%   BMI 29.69 kg/m   Physical Exam Vitals and nursing note reviewed.  Constitutional:      Appearance: Normal appearance.  HENT:     Head: Normocephalic and atraumatic.     Jaw: There is normal jaw occlusion. Swelling present.     Salivary Glands: Right salivary gland is diffusely enlarged and tender.     Mouth/Throat:     Mouth: Mucous membranes are moist.     Pharynx: Oropharynx is clear. Uvula midline. No oropharyngeal exudate, posterior oropharyngeal erythema or uvula swelling.     Tonsils: No tonsillar exudate or tonsillar abscesses.  Eyes:     Pupils: Pupils are equal, round, and reactive to light.  Cardiovascular:     Rate and Rhythm: Normal rate.  Pulmonary:     Effort: Pulmonary effort is normal.  Abdominal:     General: Abdomen is flat.  Musculoskeletal:        General: Normal range of motion.     Cervical back: Normal range of motion and neck supple.  Skin:    General: Skin is warm and dry.  Neurological:     Mental Status: She is alert and oriented to person, place, and time.     ED Results / Procedures / Treatments   Labs (all labs ordered are listed, but only abnormal results are displayed) Labs Reviewed - No data to display  EKG None  Radiology No results found.  Procedures Procedures   Medications Ordered in ED Medications - No data to display  ED Course  I have reviewed the triage vital signs and the nursing notes.  Pertinent labs & imaging results that were available during my care of the patient were reviewed by me and considered in my medical decision making (see chart for details).    MDM  Rules/Calculators/A&P  Patient presents to the ED with sudden onset of right side facial swelling. Has tried over the counter medication without much improvement in her symptoms.  She does report prior dental work 2 years ago, however no recent  dental pain or abscesses noted.  There is pain with movement of her TMJ, she is able to tolerate her secretions, she has not had any fever or URI symptoms.  During evaluation patient arrived in the ED with stable vital signs, she is afebrile, she is able to tolerate her secretions, uvula is midline, oropharynx is nonerythematous, with no exudates present or PTA.  There is pain with palpation of the parotid gland along the right side of her face.  No cervical adenopathy. Swelling is unilateral in nature, no swelling to the right side.   We discussed symptomatic treatment with warm compresses, histologist, lemon drops along with lemon wedges.  We also discussed antibiotic therapy, in order to prevent any infection.  Patient is agreeable of treatment at this time.  She is advised to return if she has any issues tolerating her secretions, difficulty breathing or shortness of breath.  Patient is agreeable at this time, return precautions discussed at length.    Portions of this note were generated with [employer]. Dictation errors may occur despite best attempts at proofreading.  Final Clinical Impression(s) / ED Diagnoses Final diagnoses:  Parotid sialolithiasis    Rx / DC Orders ED Discharge Orders         Ordered    cephALEXin (KEFLEX) 500 MG capsule  Every 6 hours        01/16/21 1043           Janeece Fitting, PA-C 01/16/21 [place], Wenda Overland, MD 01/16/21 1506

## 2021-01-16 NOTE — Discharge Instructions (Addendum)
Please do:  -warm compresses,  -gland massage, and sialogogues such as chewing gum, tart candies (eg, lemon drops), or lemon wedges.  I have also prescribed antibiotics to help prevent infection, please take 1 tablet every 6 hours for the next 7 days.   If you experience any worsening symptoms, problems tolerating your secretions return to the ED.

## 2021-01-16 NOTE — ED Notes (Signed)
ED Provider at bedside. 

## 2021-01-16 NOTE — ED Notes (Signed)
Pt discharged to home. Discharge instructions have been discussed with patient and/or family members. Pt verbally acknowledges understanding d/c instructions, and endorses comprehension to checkout at registration before leaving.  °

## 2021-01-16 NOTE — ED Notes (Signed)
Pt verbalizes wanting to leave based on wait time to see provider. This RN explained that there are other patients that will be seen ahead of patient, however she will be seen as per acuity of patients. Pt endorses understanding and reports waiting a little longer to be seen.

## 2021-07-18 ENCOUNTER — Emergency Department (HOSPITAL_BASED_OUTPATIENT_CLINIC_OR_DEPARTMENT_OTHER)
Admission: EM | Admit: 2021-07-18 | Discharge: 2021-07-18 | Disposition: A | Discharge status: Home / Self Care | Payer: 59 | Attending: Emergency Medicine | Admitting: Emergency Medicine

## 2021-07-18 ENCOUNTER — Other Ambulatory Visit: Payer: Self-pay

## 2021-07-18 DIAGNOSIS — M5441 Lumbago with sciatica, right side: Secondary | ICD-10-CM

## 2021-07-18 DIAGNOSIS — I10 Essential (primary) hypertension: Secondary | ICD-10-CM | POA: Diagnosis not present

## 2021-07-18 DIAGNOSIS — Z7982 Long term (current) use of aspirin: Secondary | ICD-10-CM | POA: Diagnosis not present

## 2021-07-18 DIAGNOSIS — M545 Low back pain, unspecified: Secondary | ICD-10-CM | POA: Diagnosis present

## 2021-07-18 DIAGNOSIS — Z79899 Other long term (current) drug therapy: Secondary | ICD-10-CM | POA: Diagnosis not present

## 2021-07-18 NOTE — ED Notes (Signed)
D/c paperwork offered to pt, to which she stated "I don't want that, you can give it to that doctor."  Pt ambulatory to ED exit, NAD.

## 2021-07-18 NOTE — Discharge Instructions (Signed)
Call your primary care doctor or specialist as discussed in the next 2-3 days.   Return immediately back to the ER if:  Your symptoms worsen within the next 12-24 hours. You develop new symptoms such as new fevers, persistent vomiting, new pain, shortness of breath, or new weakness or numbness, or if you have any other concerns.  

## 2021-07-18 NOTE — ED Triage Notes (Signed)
Pt POV reports ongoing right lower back pain x1 month. Pt reports pain is worst at night, radiates down right leg. Worse with movement. Denies any known trauma to area. Reports dx with degenerative disc "a couple years ago."

## 2021-07-18 NOTE — ED Provider Notes (Signed)
[facility] Provider Note   CSN: [id number] Arrival date & time: 07/18/21  0801     History Chief Complaint  Patient presents with   Back Pain    Denise Mejia is a 60 y.o. female.  Patient presents with chief complaint of lower back pain.  Describes it as sharp and aching.  States is been ongoing for about a month.  She has exacerbation of it yesterday.  Typically she states is worse when she lays down for a long time, woke up with pain this morning.  Describes sharp and aching in the right lower back rating down to her knees.  This has been similar pain for the past month.  Denies any new numbness or weakness.  Denies any bowel or bladder dysfunction.  Denies any recent falls.  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      Past Medical History:  Diagnosis Date   Bronchitis    reports hx of frequent   Depression    pt states she does not have history of depresssion 02-18-2014   Fibroid    GERD (gastroesophageal reflux disease)    Hypertension     Patient Active Problem List   Diagnosis Date Noted   Angina at rest [facility]) 04/03/2020   Chest pain 04/03/2020   Essential hypertension 05/14/2013   Murmur 05/14/2013   Bronchitis    Fibroid    Depression     Past Surgical History:  Procedure Laterality Date   ABDOMINAL HYSTERECTOMY     CARPAL TUNNEL RELEASE     bilateral   INDUCED ABORTION     X2     OB History     Gravida  3   Para  1   Term  1   Preterm  0   AB  2   Living  1      SAB  0   IAB  2   Ectopic  0   Multiple  0   Live Births  1           Family History  Problem Relation Age of Onset   Stroke Mother     Social History   Tobacco Use   Smoking status: Never   Smokeless tobacco: Never  Substance Use Topics   Alcohol use: No   Drug use: No    Home Medications Prior to Admission medications   Medication Sig Start Date End Date Taking? Authorizing Provider  albuterol  (PROVENTIL HFA;VENTOLIN HFA) 108 (90 Base) MCG/ACT inhaler Inhale 2 puffs into the lungs 4 (four) times daily as needed for wheezing or shortness of breath.  02/18/14   [provider]  amLODipine (NORVASC) 10 MG tablet Take 10 mg by mouth daily. 04/02/20   [provider]  aspirin EC 81 MG EC tablet Take 1 tablet (81 mg total) by mouth daily. Swallow whole. 04/04/20   Aline August, MD  atorvastatin (LIPITOR) 20 MG tablet Take 20 mg by mouth daily. 02/16/20   [provider]  gabapentin (NEURONTIN) 300 MG capsule Take 300 mg by mouth in the morning and at bedtime. 01/14/20   [provider]  metoprolol succinate (TOPROL-XL) 25 MG 24 hr tablet Take 1 tablet by mouth every morning. 10/11/17   [provider]  nitroGLYCERIN (NITROSTAT) 0.4 MG SL tablet Place 1 tablet (0.4 mg total) under the tongue every 5 (five) minutes as needed for chest pain. 04/04/20   Aline August, MD  orphenadrine (  NORFLEX) 100 MG tablet Take 1 tablet (100 mg total) by mouth 2 (two) times daily. 12/13/17   Charlesetta Shanks, MD  spironolactone (ALDACTONE) 25 MG tablet Take 1 tablet by mouth daily. 10/11/17   [provider]  traZODone (DESYREL) 50 MG tablet Take 25 mg by mouth daily.  01/20/20   [provider]  Vitamin D, Ergocalciferol, (DRISDOL) 1.25 MG (50000 UNIT) CAPS capsule Take 50,000 Units by mouth every 7 (seven) days.    [provider]    Allergies    Lisinopril  Review of Systems   Review of Systems  Constitutional:  Negative for fever.  HENT:  Negative for ear pain.   Eyes:  Negative for pain.  Respiratory:  Negative for cough.   Cardiovascular:  Negative for chest pain.  Gastrointestinal:  Negative for abdominal pain.  Genitourinary:  Negative for flank pain.  Musculoskeletal:  Positive for back pain.  Skin:  Negative for rash.  Neurological:  Negative for headaches.   Physical Exam Updated Vital Signs BP (!) 167/84 (BP Location: Right Arm)    Pulse 70   Temp 99.4 F (37.4 C) (Oral)   Resp 18   Ht 6\' 1"  (1.854 m)   Wt 104.8 kg   SpO2 100%   BMI 30.48 kg/m   Physical Exam Constitutional:      General: She is not in acute distress.    Appearance: Normal appearance.  HENT:     Head: Normocephalic.     Nose: Nose normal.  Eyes:     Extraocular Movements: Extraocular movements intact.  Cardiovascular:     Rate and Rhythm: Normal rate.  Pulmonary:     Effort: Pulmonary effort is normal.  Musculoskeletal:        General: Normal range of motion.     Cervical back: Normal range of motion.     Comments: No C or T-spine tenderness.  L-spine 3-4 right paraspinal tenderness present.  Neurological:     General: No focal deficit present.     Mental Status: She is alert. Mental status is at baseline.     Comments: No C or T-spine midline step-offs or tenderness noted on exam.  5/5 strength all extremities.  Intact bilateral lower extremity hip flexion extension and knee flexion extension and ankle flexion extension strength.  Positive straight leg test on the right lower extremity.  Gait is mildly antalgic with normal without assistance.    ED Results / Procedures / Treatments   Labs (all labs ordered are listed, but only abnormal results are displayed) Labs Reviewed - No data to display  EKG None  Radiology No results found.  Procedures Procedures   Medications Ordered in ED Medications - No data to display  ED Course  I have reviewed the triage vital signs and the nursing notes.  Pertinent labs & imaging results that were available during my care of the patient were reviewed by me and considered in my medical decision making (see chart for details).    MDM Rules/Calculators/A&P                           Patient presents with right-sided lower back pain rating down the leg, consistent with herniated disc likely sciatica.  No red flag signs noted on history or exam.  I discussed with patient treatment  modalities, I asked her if she wanted any narcotic medications as she states she has been taking 1000 mg of Motrin without  improvement.  Patient appeared to take offense my offer of narcotics, stating that she just wants a shot.  I asked her a shot of what specific medicine she was inquiring about, at which point she continued to become more upset.  Patient declined any additional intervention, and left prior to completion of visit.  Final Clinical Impression(s) / ED Diagnoses Final diagnoses:  Right-sided low back pain with right-sided sciatica, unspecified chronicity    Rx / DC Orders ED Discharge Orders     None        Luna Fuse, MD 07/18/21 [phone]

## 2021-09-11 ENCOUNTER — Other Ambulatory Visit: Payer: Self-pay

## 2021-09-11 ENCOUNTER — Emergency Department (HOSPITAL_BASED_OUTPATIENT_CLINIC_OR_DEPARTMENT_OTHER): Payer: Commercial Managed Care - HMO

## 2021-09-11 ENCOUNTER — Encounter (HOSPITAL_BASED_OUTPATIENT_CLINIC_OR_DEPARTMENT_OTHER): Payer: Self-pay | Admitting: Emergency Medicine

## 2021-09-11 ENCOUNTER — Emergency Department (HOSPITAL_BASED_OUTPATIENT_CLINIC_OR_DEPARTMENT_OTHER)
Admission: EM | Admit: 2021-09-11 | Discharge: 2021-09-11 | Disposition: A | Discharge status: Home / Self Care | Payer: Commercial Managed Care - HMO | Attending: Emergency Medicine | Admitting: Emergency Medicine

## 2021-09-11 DIAGNOSIS — Z7982 Long term (current) use of aspirin: Secondary | ICD-10-CM | POA: Diagnosis not present

## 2021-09-11 DIAGNOSIS — M25551 Pain in right hip: Secondary | ICD-10-CM | POA: Insufficient documentation

## 2021-09-11 DIAGNOSIS — M5441 Lumbago with sciatica, right side: Secondary | ICD-10-CM | POA: Diagnosis present

## 2021-09-11 DIAGNOSIS — G8929 Other chronic pain: Secondary | ICD-10-CM

## 2021-09-11 DIAGNOSIS — Z79899 Other long term (current) drug therapy: Secondary | ICD-10-CM | POA: Insufficient documentation

## 2021-09-11 MED ORDER — LIDOCAINE 5 % EX PTCH
1.0000 | MEDICATED_PATCH | Freq: Once | CUTANEOUS | Status: DC
  Administered 2021-09-11: 1 via TRANSDERMAL
  Filled 2021-09-11: qty 1

## 2021-09-11 MED ORDER — ACETAMINOPHEN 325 MG PO TABS
650.0000 mg | ORAL_TABLET | Freq: Once | ORAL | Status: AC
  Administered 2021-09-11: 650 mg via ORAL
  Filled 2021-09-11: qty 2

## 2021-09-11 MED ORDER — PREDNISONE 20 MG PO TABS: ORAL_TABLET | ORAL | 0 refills | Status: DC

## 2021-09-11 MED ORDER — DEXAMETHASONE SODIUM PHOSPHATE 10 MG/ML IJ SOLN
10.0000 mg | Freq: Once | INTRAMUSCULAR | Status: AC
  Administered 2021-09-11: 10 mg via INTRAMUSCULAR
  Filled 2021-09-11: qty 1

## 2021-09-11 NOTE — ED Triage Notes (Addendum)
Pt c/o back pain since Oct; worse x few days on RT side, now affecting ambulation; took muscle relaxer at 1200; pt drove self to ED

## 2021-09-11 NOTE — ED Notes (Signed)
Patient has been seen by orthopedist on 12/15 and 08/25/21 for treatment of sacroillitis. Had steroid injection on 12/22  but has had minimal relief.  She has called the orthopedist office but no one has returned her call.   Has taken a muscle relaxer at 12noon and taking gabapentin TID as prescribed.   Has used heating pad, ice packs prn Has started physical therapy but that is on hold pending approval from insurance for 2023.

## 2021-09-11 NOTE — Discharge Instructions (Addendum)
It was a pleasure taking care of you today!  Your x-ray of your hip did not show any fracture or dislocation.  You will be sent prescription for prednisone taper, take as prescribed.  You may use over-the-counter lidocaine patches as needed for your symptoms.  Ensure to remove the patch after 12 hours and replace with a new patch.  You may follow-up with your primary care provider or orthopedist as needed for follow-up regarding today's ED visit.  You may continue taking your other prescription medications as prescribed.  You may take over-the-counter 600 mg ibuprofen every 6 hours or 1000 mg Tylenol every 6 hours as needed for pain. You may take return to the ED if you experience increasing/worsening back pain, inability to walk, or worsening symptoms.

## 2021-09-11 NOTE — ED Provider Notes (Signed)
[facility] DEPARTMENT Provider Note   CSN: [id number] Arrival date & time: 09/11/21  1541     History  Chief Complaint  Patient presents with   Back Pain    Denise Mejia is a 61 y.o. female with a past medical history of back pain who presents to the ED complaining of right lower back pain onset 2 months.  Patient has been evaluated by her primary care provider, orthopedist, PT, ED multiple times for her pain since its onset.  She has tried muscle relaxer, gabapentin, heating pad, ice packs, icy hot with no relief of symptoms.  Denies fever, chills, chest pain, shortness of breath, abdominal pain, nausea, vomiting, bowel/bladder incontinence, cauda equina symptoms.  Denies history of diabetes.  The history is provided by the patient. No language interpreter was used.      Home Medications Prior to Admission medications   Medication Sig Start Date End Date Taking? Authorizing Provider  albuterol (PROVENTIL HFA;VENTOLIN HFA) 108 (90 Base) MCG/ACT inhaler Inhale 2 puffs into the lungs 4 (four) times daily as needed for wheezing or shortness of breath.  02/18/14   [provider]  amLODipine (NORVASC) 10 MG tablet Take 10 mg by mouth daily. 04/02/20   [provider]  aspirin EC 81 MG EC tablet Take 1 tablet (81 mg total) by mouth daily. Swallow whole. 04/04/20   Aline August, MD  atorvastatin (LIPITOR) 20 MG tablet Take 20 mg by mouth daily. 02/16/20   [provider]  gabapentin (NEURONTIN) 300 MG capsule Take 300 mg by mouth in the morning and at bedtime. 01/14/20   [provider]  metoprolol succinate (TOPROL-XL) 25 MG 24 hr tablet Take 1 tablet by mouth every morning. 10/11/17   [provider]  nitroGLYCERIN (NITROSTAT) 0.4 MG SL tablet Place 1 tablet (0.4 mg total) under the tongue every 5 (five) minutes as needed for chest pain. 04/04/20   Aline August, MD  orphenadrine (NORFLEX) 100 MG tablet Take 1 tablet (100 mg total)  by mouth 2 (two) times daily. 12/13/17   Charlesetta Shanks, MD  predniSONE (DELTASONE) 20 MG tablet 3 tabs po day one, then 2 po daily x 4 days 09/11/21   Morad Tal A, PA-C  spironolactone (ALDACTONE) 25 MG tablet Take 1 tablet by mouth daily. 10/11/17   [provider]  traZODone (DESYREL) 50 MG tablet Take 25 mg by mouth daily.  01/20/20   [provider]  Vitamin D, Ergocalciferol, (DRISDOL) 1.25 MG (50000 UNIT) CAPS capsule Take 50,000 Units by mouth every 7 (seven) days.    [provider]      Allergies    Lisinopril    Review of Systems   Review of Systems  Constitutional:  Negative for chills and fever.  Respiratory:  Negative for shortness of breath.   Cardiovascular:  Negative for chest pain.  Gastrointestinal:  Negative for abdominal pain, nausea and vomiting.  Genitourinary:  Negative for dysuria and hematuria.  Musculoskeletal:  Positive for back pain. Negative for gait problem.  Skin:  Negative for rash.  Neurological:  Negative for weakness and numbness.       -Tingling  All other systems reviewed and are negative.  Physical Exam Updated Vital Signs BP 127/60 (BP Location: Left Arm)    Pulse 78    Temp 99.1 F (37.3 C) (Oral)    Resp 16    Ht 6\' 1"  (1.854 m)    Wt 105.2 kg    SpO2 99%  BMI 30.61 kg/m  Physical Exam Vitals and nursing note reviewed.  Constitutional:      General: She is not in acute distress.    Appearance: She is not diaphoretic.  HENT:     Head: Normocephalic and atraumatic.     Mouth/Throat:     Pharynx: No oropharyngeal exudate.  Eyes:     General: No scleral icterus.    Conjunctiva/sclera: Conjunctivae normal.  Cardiovascular:     Rate and Rhythm: Normal rate and regular rhythm.     Pulses: Normal pulses.     Heart sounds: Normal heart sounds.  Pulmonary:     Effort: Pulmonary effort is normal. No respiratory distress.     Breath sounds: Normal breath sounds. No wheezing.  Abdominal:     General: Bowel sounds  are normal.     Palpations: Abdomen is soft. There is no mass.     Tenderness: There is no abdominal tenderness. There is no guarding or rebound.  Musculoskeletal:        General: Normal range of motion.     Cervical back: Normal range of motion and neck supple.     Comments: Tenderness to palpation noted to right lumbar musculature.  No overlying skin changes.  No C, T, L, spine tenderness to palpation.  Tenderness to palpation to right hip.  Positive straight leg raise on the right.  Negative straight leg raise on the left.  Full active range of motion of bilateral hips.  Strength and sensation intact to bilateral lower extremities.  DP and PT pulses intact bilaterally.  Patient able to ambulate in the ED without assistance or difficulty.  Patient with mild antalgic gait in the ED secondary to pain.  Skin:    General: Skin is warm and dry.  Neurological:     Mental Status: She is alert.  Psychiatric:        Behavior: Behavior normal.    ED Results / Procedures / Treatments   Labs (all labs ordered are listed, but only abnormal results are displayed) Labs Reviewed - No data to display  EKG None  Radiology DG Hip Unilat W or Wo Pelvis 2-3 Views Right  Result Date: 09/11/2021 CLINICAL DATA:  Hip pain EXAM: DG HIP (WITH OR WITHOUT PELVIS) 2-3V RIGHT COMPARISON:  None. FINDINGS: SI joints are non widened. Pubic symphysis and rami are intact. No fracture or malalignment. Joint space is patent IMPRESSION: Negative. Electronically Signed   By: Donavan Foil M.D.   On: 09/11/2021 19:06    Procedures Procedures    Medications Ordered in ED Medications  lidocaine (LIDODERM) 5 % 1 patch (1 patch Transdermal Patch Applied 09/11/21 1812)  dexamethasone (DECADRON) injection 10 mg (10 mg Intramuscular Given 09/11/21 1812)  acetaminophen (TYLENOL) tablet 650 mg (650 mg Oral Given 09/11/21 1812)    ED Course/ Medical Decision Making/ A&P Clinical Course as of 09/11/21 1925  Nancy Fetter Sep 11, 2021  1838  Patient reevaluated and noted improvement of symptoms with treatment regimen in the ED. [SB]  1914 Re-evaluated and patient asleep on stretcher, noted improvement of symptoms with treatment regimen. Discussed discharge treatment plan. Pt agreeable at this time. Pt appears safe for discharge. [SB]    Clinical Course User Index [SB] Shaune Westfall A, PA-C                           Medical Decision Making  Patient with right lumbar back pain without radiation. Pt with history  of sacroiliitis.  Patient is followed by Ortho for her sacroiliitis. No neurological deficits and normal neuro exam.  Patient is ambulatory without assistance or difficulty. No loss of bowel or bladder control.  No concern for cauda equina.  No fever, night sweats, weight loss, h/o cancer, IVDA, no recent procedure to back. No urinary symptoms suggestive of UTI.  On exam, patient with tenderness to palpation to right lumbar musculature.  No spinal tenderness to palpation.  Tenderness palpation the right hip.  Positive straight leg raise to the right.  Strength sensation intact to bilateral lower extremities.  Patient able to ambulate with mild antalgic gait without assistance or difficulty in the ED. No concerning findings on exams. Differential diagnosis includes fracture, dislocation, sciatica, herniation, DDD.  Additional history obtained:  External records from outside source obtained and reviewed including:  Patient was evaluated by her orthopedist on 12/15 and 08/25/2021.  Patient was given a steroid injection on 12/22 for sacroiliitis.  Imaging: I ordered imaging studies including right hip x-ray I independently visualized and interpreted imaging which showed no acute fracture or dislocation. I agree with the radiologist interpretation  Medications:  I ordered medication including Decadron, Lidoderm patch, Tylenol for pain management Reevaluation of the patient after these medicines showed that the patient resolved I  have reviewed the patients home medicines and have made adjustments as needed  Reevaluation: After the interventions noted above, I reevaluated the patient and found that they have :resolved   Disposition: Patient presentation suspicious for sciatica.  Doubt fracture, dislocation, herniation, DDD at this time.  After consideration of the diagnostic results and the patients response to treatment, I feel that the patent would benefit from discharge home with prednisone, continuation of her home muscle relaxer, gabapentin.  Also, instructed patient on over-the-counter lidocaine patches. Supportive care measures and strict return precautions discussed with patient at bedside. Pt acknowledges and verbalizes understanding. Pt appears safe for discharge. Follow up as indicated in discharge paperwork.   This chart was dictated using voice recognition software, Dragon. Despite the best efforts of this provider to proofread and correct errors, errors may still occur which can change documentation meaning.  Final Clinical Impression(s) / ED Diagnoses Final diagnoses:  Chronic right-sided low back pain with right-sided sciatica    Rx / DC Orders ED Discharge Orders          Ordered    predniSONE (DELTASONE) 20 MG tablet  Status:  Discontinued        09/11/21 1919    predniSONE (DELTASONE) 20 MG tablet        09/11/21 1919              Candee Hoon A, PA-C 09/11/21 1931    Lennice Sites, DO 09/11/21 1954

## 2021-10-31 ENCOUNTER — Other Ambulatory Visit: Payer: Self-pay

## 2021-10-31 ENCOUNTER — Encounter (HOSPITAL_BASED_OUTPATIENT_CLINIC_OR_DEPARTMENT_OTHER): Payer: Self-pay | Admitting: Emergency Medicine

## 2021-10-31 ENCOUNTER — Emergency Department (HOSPITAL_BASED_OUTPATIENT_CLINIC_OR_DEPARTMENT_OTHER)
Admission: EM | Admit: 2021-10-31 | Discharge: 2021-10-31 | Disposition: A | Discharge status: Home / Self Care | Payer: Managed Care, Other (non HMO) | Attending: Emergency Medicine | Admitting: Emergency Medicine

## 2021-10-31 DIAGNOSIS — Z7982 Long term (current) use of aspirin: Secondary | ICD-10-CM | POA: Insufficient documentation

## 2021-10-31 DIAGNOSIS — M5431 Sciatica, right side: Secondary | ICD-10-CM

## 2021-10-31 MED ORDER — DEXAMETHASONE SODIUM PHOSPHATE 10 MG/ML IJ SOLN
10.0000 mg | Freq: Once | INTRAMUSCULAR | Status: AC
  Administered 2021-10-31: 10 mg via INTRAMUSCULAR
  Filled 2021-10-31: qty 1

## 2021-10-31 NOTE — ED Provider Notes (Signed)
[facility]ERGENCY DEPARTMENT Provider Note   CSN: [id number] Arrival date & time: 10/31/21  1031     History  Chief Complaint  Patient presents with   Back Pain    Denise Mejia is a 61 y.o. female.  61 year old female presents with history of right-sided sciatica with complaint of acute on chronic flare.  Patient denies falls, injuries, fever or changes in her typical symptoms.  Patient was being seen by pain management at [facility] with her last appointment just prior to Christmas of last year however states she has had an change in insurance and this provider is no longer in her network.  Patient was last seen in this emergency room on January 8, treated with IM Decadron and states that this helped with her pain until just now.  She is here requesting repeat injection.      Home Medications Prior to Admission medications   Medication Sig Start Date End Date Taking? Authorizing Provider  albuterol (PROVENTIL HFA;VENTOLIN HFA) 108 (90 Base) MCG/ACT inhaler Inhale 2 puffs into the lungs 4 (four) times daily as needed for wheezing or shortness of breath.  02/18/14   [provider]  amLODipine (NORVASC) 10 MG tablet Take 10 mg by mouth daily. 04/02/20   [provider]  aspirin EC 81 MG EC tablet Take 1 tablet (81 mg total) by mouth daily. Swallow whole. 04/04/20   Aline August, MD  atorvastatin (LIPITOR) 20 MG tablet Take 20 mg by mouth daily. 02/16/20   [provider]  gabapentin (NEURONTIN) 300 MG capsule Take 300 mg by mouth in the morning and at bedtime. 01/14/20   [provider]  metoprolol succinate (TOPROL-XL) 25 MG 24 hr tablet Take 1 tablet by mouth every morning. 10/11/17   [provider]  nitroGLYCERIN (NITROSTAT) 0.4 MG SL tablet Place 1 tablet (0.4 mg total) under the tongue every 5 (five) minutes as needed for chest pain. 04/04/20   Aline August, MD  orphenadrine (NORFLEX) 100 MG tablet Take 1 tablet (100 mg total)  by mouth 2 (two) times daily. 12/13/17   Charlesetta Shanks, MD  predniSONE (DELTASONE) 20 MG tablet 3 tabs po day one, then 2 po daily x 4 days 09/11/21   Blue, Soijett A, PA-C  spironolactone (ALDACTONE) 25 MG tablet Take 1 tablet by mouth daily. 10/11/17   [provider]  traZODone (DESYREL) 50 MG tablet Take 25 mg by mouth daily.  01/20/20   [provider]  Vitamin D, Ergocalciferol, (DRISDOL) 1.25 MG (50000 UNIT) CAPS capsule Take 50,000 Units by mouth every 7 (seven) days.    [provider]      Allergies    Lisinopril    Review of Systems   Review of Systems Negative except as per HPI Physical Exam Updated Vital Signs BP (!) 176/68 (BP Location: Right Arm)    Pulse 82    Temp 98.3 F (36.8 C) (Oral)    Resp 16    Ht 6\' 1"  (1.854 m)    Wt 106.6 kg    SpO2 100%    BMI 31.00 kg/m  Physical Exam Vitals and nursing note reviewed.  Constitutional:      General: She is not in acute distress.    Appearance: She is well-developed. She is not diaphoretic.  HENT:     Head: Normocephalic and atraumatic.  Cardiovascular:     Pulses: Normal pulses.  Pulmonary:     Effort: Pulmonary effort is normal.  Musculoskeletal:  General: Tenderness present.     Cervical back: No tenderness or bony tenderness.     Thoracic back: No tenderness or bony tenderness.     Lumbar back: No tenderness or bony tenderness.       Back:     Right lower leg: No edema.     Left lower leg: No edema.     Comments: Right SI tenderness   Skin:    General: Skin is warm and dry.     Findings: No erythema or rash.  Neurological:     Mental Status: She is alert and oriented to person, place, and time.     Sensory: No sensory deficit.     Motor: No weakness.     Gait: Gait normal.  Psychiatric:        Behavior: Behavior normal.    ED Results / Procedures / Treatments   Labs (all labs ordered are listed, but only abnormal results are displayed) Labs Reviewed - No data to  display  EKG None  Radiology No results found.  Procedures Procedures    Medications Ordered in ED Medications  dexamethasone (DECADRON) injection 10 mg (has no administration in time range)    ED Course/ Medical Decision Making/ A&P                           Medical Decision Making Risk Prescription drug management.   62 year old female with complaint of acute on chronic right-sided sciatica without change in her baseline chronic pain symptoms.  Review of records, last had SI joint injection on 08/25/2021, states this gave her relief until she presented to the emergency room on January 8 where she was treated with IM Decadron in the Lidoderm patch.  States she is trying everything over-the-counter without relief of her pain. Patient is agitated after waiting a total of 40 minutes in the emergency department, requesting injection of cortisone as per her last emergency room visit. Exam consistent with sciatica. Will treat with same decadron dose as given at her last ER visit, strongly encouraged to follow up with her PCP to arrange for follow up with an in-network provider who can further manage her condition.         Final Clinical Impression(s) / ED Diagnoses Final diagnoses:  Sciatica of right side    Rx / DC Orders ED Discharge Orders     None         Tacy Learn, PA-C 10/31/21 [place], Ankit, MD 11/01/21 [phone]

## 2021-10-31 NOTE — Discharge Instructions (Signed)
Call your doctor for further care

## 2021-10-31 NOTE — ED Triage Notes (Signed)
Rt back pain that rads to hip and down leg x 2 weeks , pt states that same as last time she was here

## 2022-02-16 ENCOUNTER — Ambulatory Visit (INDEPENDENT_AMBULATORY_CARE_PROVIDER_SITE_OTHER): Payer: Commercial Managed Care - HMO | Admitting: Family Medicine

## 2022-02-16 ENCOUNTER — Ambulatory Visit (HOSPITAL_BASED_OUTPATIENT_CLINIC_OR_DEPARTMENT_OTHER)
Admission: RE | Admit: 2022-02-16 | Discharge: 2022-02-16 | Disposition: A | Discharge status: Home / Self Care | Payer: Commercial Managed Care - HMO | Source: Ambulatory Visit | Attending: Family Medicine | Admitting: Family Medicine

## 2022-02-16 ENCOUNTER — Encounter: Payer: Self-pay | Admitting: Family Medicine

## 2022-02-16 VITALS — BP 136/57 | HR 90 | Ht 73.0 in | Wt 242.4 lb

## 2022-02-16 DIAGNOSIS — M5442 Lumbago with sciatica, left side: Secondary | ICD-10-CM | POA: Insufficient documentation

## 2022-02-16 DIAGNOSIS — G8929 Other chronic pain: Secondary | ICD-10-CM

## 2022-02-16 DIAGNOSIS — E669 Obesity, unspecified: Secondary | ICD-10-CM | POA: Insufficient documentation

## 2022-02-16 DIAGNOSIS — I10 Essential (primary) hypertension: Secondary | ICD-10-CM

## 2022-02-16 DIAGNOSIS — M5441 Lumbago with sciatica, right side: Secondary | ICD-10-CM | POA: Insufficient documentation

## 2022-02-16 DIAGNOSIS — F32A Depression, unspecified: Secondary | ICD-10-CM | POA: Diagnosis not present

## 2022-02-16 DIAGNOSIS — R011 Cardiac murmur, unspecified: Secondary | ICD-10-CM

## 2022-02-16 HISTORY — DX: Other chronic pain: G89.29

## 2022-02-16 HISTORY — DX: Obesity, unspecified: E66.9

## 2022-02-16 NOTE — Progress Notes (Signed)
New Patient Office Visit  Subjective    Patient ID: Denise Mejia, female    DOB: 01-Dec-1960  Age: 61 y.o. MRN: [id number]  CC:  Chief Complaint  Patient presents with   Back Pain   Fatigue    HPI Denise Mejia presents to establish care   HTN - Patient reports that she is on a combination losartan-hydrochlorothiazide blood pressure pill along with amlodipine.  Reports has been well controlled and she is trying to work on a low-sodium diet and regular physical activity.  She has been following with cardiology in the past and would normally see them every 6 months or so.  No concerns today. Patient denies any chest pain, palpitations, dyspnea, wheezing, edema, recurrent headaches, vision changes.   HLD - lipitor 10 mg, well-controlled per patient.  Depression/anxiety - Wellbutrin seems to be working fairly well, No SI/HI, no panic attacks, followed with psych in the past, but insurance not covering any more, no therapy/counseling  Back pain -patient reports that starting last November she began having some significant lower back pain with right-sided sciatica symptoms.  She has been to multiple ED visits along with a spine specialist.  States she did get an SI injection at 1 point with minimal improvement.  Reports symptoms have significantly improved although she is also began feeling soft symptoms on the left side.  She will get occasional flares, but as of late has been well controlled.  She did try physical therapy for a while but it was getting too expensive so she has been doing at home exercises instead.  She will take occasional naproxen for flares.  She is currently doing pretty well with mostly stiffness and no pain.    Outpatient Encounter Medications as of 02/16/2022  Medication Sig   albuterol (PROVENTIL HFA;VENTOLIN HFA) 108 (90 Base) MCG/ACT inhaler Inhale 2 puffs into the lungs 4 (four) times daily as needed for wheezing or shortness of breath.    amLODipine (NORVASC) 10 MG  tablet Take 10 mg by mouth daily.   aspirin EC 81 MG EC tablet Take 1 tablet (81 mg total) by mouth daily. Swallow whole.   atorvastatin (LIPITOR) 20 MG tablet Take 20 mg by mouth daily.   gabapentin (NEURONTIN) 300 MG capsule Take 300 mg by mouth in the morning and at bedtime.   losartan-hydrochlorothiazide (HYZAAR) 100-25 MG tablet Take 1 tablet by mouth daily.   nitroGLYCERIN (NITROSTAT) 0.4 MG SL tablet Place 1 tablet (0.4 mg total) under the tongue every 5 (five) minutes as needed for chest pain.   orphenadrine (NORFLEX) 100 MG tablet Take 1 tablet (100 mg total) by mouth 2 (two) times daily.   spironolactone (ALDACTONE) 25 MG tablet Take 1 tablet by mouth daily.   traZODone (DESYREL) 50 MG tablet Take 25 mg by mouth daily.    Vitamin D, Ergocalciferol, (DRISDOL) 1.25 MG (50000 UNIT) CAPS capsule Take 50,000 Units by mouth every 7 (seven) days.   [DISCONTINUED] metoprolol succinate (TOPROL-XL) 25 MG 24 hr tablet Take 1 tablet by mouth every morning.   [DISCONTINUED] predniSONE (DELTASONE) 20 MG tablet 3 tabs po day one, then 2 po daily x 4 days   No facility-administered encounter medications on file as of 02/16/2022.    Past Medical History:  Diagnosis Date   Anxiety    Arthritis    Bronchitis    reports hx of frequent   Depression    pt states she does not have history of depresssion 02-18-2014   Fibroid  GERD (gastroesophageal reflux disease)    Heart murmur    Hyperlipidemia    Hypertension    Sleep apnea     Past Surgical History:  Procedure Laterality Date   ABDOMINAL HYSTERECTOMY     CARPAL TUNNEL RELEASE     bilateral   INDUCED ABORTION     X2    Family History  Problem Relation Age of Onset   Stroke Mother    Hypertension Sister     Social History   Socioeconomic History   Marital status: Single    Spouse name: Not on file   Number of children: 1   Years of education: Not on file   Highest education level: Not on file  Occupational History     Comment: Nurses aide  Tobacco Use   Smoking status: Never   Smokeless tobacco: Never  Substance and Sexual Activity   Alcohol use: No   Drug use: No   Sexual activity: Not on file  Other Topics Concern   Not on file  Social History Narrative   Not on file   Social Determinants of Health   Financial Resource Strain: Not on file  Food Insecurity: Not on file  Transportation Needs: Not on file  Physical Activity: Not on file  Stress: Not on file  Social Connections: Not on file  Intimate Partner Violence: Not on file    ROS All review of systems negative except what is listed in the HPI      Objective    BP (!) 136/57   Pulse 90   Ht '6\' 1"'$  (1.854 m)   Wt 242 lb 6.4 oz (110 kg)   BMI 31.98 kg/m   Physical Exam Vitals reviewed.  Constitutional:      General: She is not in acute distress.    Appearance: Normal appearance. She is obese. She is not ill-appearing.  Cardiovascular:     Rate and Rhythm: Normal rate and regular rhythm.     Heart sounds: Murmur heard.  Musculoskeletal:     Cervical back: Normal range of motion and neck supple.  Neurological:     General: No focal deficit present.     Mental Status: She is alert and oriented to person, place, and time. Mental status is at baseline.  Psychiatric:        Mood and Affect: Mood normal.        Behavior: Behavior normal.        Thought Content: Thought content normal.        Judgment: Judgment normal.         Assessment & Plan:   Problem List Items Addressed This Visit       Cardiovascular and Mediastinum   Essential hypertension    Currently well controlled on regimen. Continue working on heart healthy diet and physical activity. Requesting a new cardiology referral as prior one is not out-of-network. Labs updated today.       Relevant Medications   losartan-hydrochlorothiazide (HYZAAR) 100-25 MG tablet   Other Relevant Orders   CBC   Comprehensive metabolic panel   Lipid panel   TSH    Ambulatory referral to Cardiology     Nervous and Auditory   Chronic midline low back pain with bilateral sciatica - Primary    Currently stable, but reports frequent flares. She is not interested in PT at this time, but will continue home exercises. Sports medicine referral placed for ongoing management. Denies needing any acute management today. No red flags  on exam.       Relevant Orders   DG Lumbar Spine Complete   Ambulatory referral to Sports Medicine     Other   Depression    Stable on Wellbutrin per patient. No SI/HI. Declines counseling at this time.       Murmur    Noted on exam. Reports it has been a long time since dx and she was following with cardiology a few times per year, but no longer in network - new referral placed. Patient aware of signs/symptoms requiring further/urgent evaluation.       Relevant Orders   Ambulatory referral to Cardiology   Obesity (BMI 30-39.9)   Relevant Orders   Hemoglobin A1c   CBC   Comprehensive metabolic panel   Lipid panel   TSH    Return in about 6 months (around 08/18/2022) for physical.   Terrilyn Saver, NP

## 2022-02-16 NOTE — Assessment & Plan Note (Signed)
Noted on exam. Reports it has been a long time since dx and she was following with cardiology a few times per year, but no longer in network - new referral placed. Patient aware of signs/symptoms requiring further/urgent evaluation.

## 2022-02-16 NOTE — Assessment & Plan Note (Signed)
Currently stable, but reports frequent flares. She is not interested in PT at this time, but will continue home exercises. Sports medicine referral placed for ongoing management. Denies needing any acute management today. No red flags on exam.

## 2022-02-16 NOTE — Patient Instructions (Signed)
Thank you for choosing [facility] for your Primary Care needs. I am excited for the opportunity to partner with you to meet your health care goals. It was a pleasure meeting you today!   Information on diet, exercise, and health maintenance recommendations are listed below. This is information to help you be sure you are on track for optimal health and monitoring.   Please look over this and let us know if you have any questions or if you have completed any of the health maintenance outside of [facility] so that we can be sure your records are up to date.  ___________________________________________________________  MyChart:  For all urgent or time sensitive needs we ask that you please call the office to avoid delays. Our number is [phone]. MyChart is not constantly monitored and due to the large volume of messages a day, replies may take up to 72 business hours.  MyChart Policy: MyChart allows for you to see your visit notes, after visit summary, provider recommendations, lab and tests results, make an appointment, request refills, and contact your provider or the office for non-urgent questions or concerns. Providers are seeing patients during normal business hours and do not have built in time to review MyChart messages.  We ask that you allow a minimum of 3 business days for responses to MyChart messages. For this reason, please do not send urgent requests through MyChart. Please call the office at [phone]. New and ongoing conditions may require a visit. We have virtual and in-person visits available for your convenience.  Complex MyChart concerns may require a visit. Your provider may request you schedule a virtual or in-person visit to ensure we are providing the best care possible. MyChart messages sent after 11:00 AM on Friday will not be received by the provider until Monday morning.    Lab and Test Results: You will receive your lab and  test results on MyChart as soon as they are completed and results have been sent by the lab or testing facility. Due to this service, you will receive your results BEFORE your provider.  I review lab and test results each morning prior to seeing patients. Some results require collaboration with other providers to ensure you are receiving the most appropriate care. For this reason, we ask that you please allow a minimum of 3-5 business days from the time that ALL results have been received for your provider to receive and review lab and test results and contact you about these.  Most lab and test result comments from the provider will be sent through MyChart. Your provider may recommend changes to the plan of care, follow-up visits, repeat testing, ask questions, or request an office visit to discuss these results. You may reply directly to this message or call the office to provide information for the provider or set up an appointment. In some instances, you will be called with test results and recommendations. Please let us know if this is preferred and we will make note of this in your chart to provide this for you.    If you have not heard a response to your lab or test results in 5 business days from all results returning to MyChart, please call the office to let us know. We ask that you please avoid calling prior to this time unless there is an emergent concern. Due to high call volumes, this can delay the resulting process.  After Hours: For all non-emergency after hours needs,   please call the office at [phone] and select the option to reach the on-call  service. On-call services are shared between multiple [facility] offices and therefore it will not be possible to speak directly with your provider. On-call providers may provide medical advice and recommendations, but are unable to provide refills for maintenance medications.  For all emergency or urgent medical needs after normal business  hours, we recommend that you seek care at the closest Urgent Care or Emergency Department to ensure appropriate treatment in a timely manner.  [facility] has a 24 hour emergency room located on the ground floor for your convenience.   Urgent Concerns During the Business Day Providers are seeing patients from 8AM to 5PM with a busy schedule and are most often not able to respond to non-urgent calls until the end of the day or the next business day. If you should have URGENT concerns during the day, please call and speak to the nurse or schedule a same day appointment so that we can address your concern without delay.   Thank you, again, for choosing me as your health care partner. I appreciate your trust and look forward to learning more about you.   Tomie Spizzirri B. Ysidra Sopher, DNP, FNP-C  ___________________________________________________________  Health Maintenance Recommendations Screening Testing Mammogram Every 1-2 years based on history and risk factors Starting at age 50 Pap Smear Ages 21-39 every 3 years Ages 30-65 every 5 years with HPV testing More frequent testing may be required based on results and history Colon Cancer Screening Every 1-10 years based on test performed, risk factors, and history Starting at age 45 Bone Density Screening Every 2-10 years based on history Starting at age 65 for women Recommendations for men differ based on medication usage, history, and risk factors AAA Screening One time ultrasound Men 65-75 years old who have ever smoked Lung Cancer Screening Low Dose Lung CT every 12 months Age 50-80 years with a 20 pack-year smoking history who still smoke or who have quit within the last 15 years  Screening Labs Routine  Labs: Complete Blood Count (CBC), Complete Metabolic Panel (CMP), Cholesterol (Lipid Panel) Every 6-12 months based on history and medications May be recommended more frequently based on current conditions or  previous results Hemoglobin A1c Lab Every 3-12 months based on history and previous results Starting at age 45 or earlier with diagnosis of diabetes, high cholesterol, BMI >26, and/or risk factors Frequent monitoring for patients with diabetes to ensure blood sugar control Thyroid Panel (TSH w/ T3 & T4) Every 6 months based on history, symptoms, and risk factors May be repeated more often if on medication HIV One time testing for all patients 13 and older May be repeated more frequently for patients with increased risk factors or exposure Hepatitis C One time testing for all patients 18 and older May be repeated more frequently for patients with increased risk factors or exposure Gonorrhea, Chlamydia Every 12 months for all sexually active persons 13-24 years Additional monitoring may be recommended for those who are considered high risk or who have symptoms PSA Men 40-54 years old with risk factors Additional screening may be recommended from age 55-69 based on risk factors, symptoms, and history  Vaccine Recommendations Tetanus Booster All adults every 10 years Flu Vaccine All patients 6 months and older every year COVID Vaccine All patients 12 years and older Initial dosing with booster May recommend additional booster based on age and health history HPV Vaccine 2 doses all patients   age 9-26 Dosing may be considered for patients over 26 Shingles Vaccine (Shingrix) 2 doses all adults 50 years and older Pneumonia (Pneumovax 23) All adults 65 years and older May recommend earlier dosing based on health history Pneumonia (Prevnar 13) All adults 65 years and older Dosed 1 year after Pneumovax 23 Pneumonia (Prevnar 20) All adults 65 years and older (adults 19-64 with certain conditions or risk factors) 1 dose  For those who have no received Prevnar 13 vaccine previously   Additional Screening, Testing, and Vaccinations may be recommended on an individualized basis based on  family history, health history, risk factors, and/or exposure.  __________________________________________________________  Diet Recommendations for All Patients  I recommend that all patients maintain a diet low in saturated fats, carbohydrates, and cholesterol. While this can be challenging at first, it is not impossible and small changes can make big differences.  Things to try: Decreasing the amount of soda, sweet tea, and/or juice to one or less per day and replace with water While water is always the first choice, if you do not like water you may consider adding a water additive without sugar to improve the taste other sugar free drinks Replace potatoes with a brightly colored vegetable at dinner Use healthy oils, such as canola oil or olive oil, instead of butter or hard margarine Limit your bread intake to two pieces or less a day Replace regular pasta with low carb pasta options Bake, broil, or grill foods instead of frying Monitor portion sizes  Eat smaller, more frequent meals throughout the day instead of large meals  An important thing to remember is, if you love foods that are not great for your health, you don't have to give them up completely. Instead, allow these foods to be a reward when you have done well. Allowing yourself to still have special treats every once in a while is a nice way to tell yourself thank you for working hard to keep yourself healthy.   Also remember that every day is a new day. If you have a bad day and "fall off the wagon", you can still climb right back up and keep moving along on your journey!  We have resources available to help you!  Some websites that may be helpful include: www.MyPlate.gov  Www.VeryWellFit.com _____________________________________________________________  Activity Recommendations for All Patients  I recommend that all adults get at least 20 minutes of moderate physical activity that elevates your heart rate at least 5  days out of the week.  Some examples include: Walking or jogging at a pace that allows you to carry on a conversation Cycling (stationary bike or outdoors) Water aerobics Yoga Weight lifting Dancing If physical limitations prevent you from putting stress on your joints, exercise in a pool or seated in a chair are excellent options.  Do determine your MAXIMUM heart rate for activity: 220 - YOUR AGE = MAX Heart Rate   Remember! Do not push yourself too hard.  Start slowly and build up your pace, speed, weight, time in exercise, etc.  Allow your body to rest between exercise and get good sleep. You will need more water than normal when you are exerting yourself. Do not wait until you are thirsty to drink. Drink with a purpose of getting in at least 8, 8 ounce glasses of water a day plus more depending on how much you exercise and sweat.    If you begin to develop dizziness, chest pain, abdominal pain, jaw pain, shortness of breath, headache,   vision changes, lightheadedness, or other concerning symptoms, stop the activity and allow your body to rest. If your symptoms are severe, seek emergency evaluation immediately. If your symptoms are concerning, but not severe, please let us know so that we can recommend further evaluation.     

## 2022-02-16 NOTE — Assessment & Plan Note (Signed)
Currently well controlled on regimen. Continue working on heart healthy diet and physical activity. Requesting a new cardiology referral as prior one is not out-of-network. Labs updated today.

## 2022-02-16 NOTE — Assessment & Plan Note (Signed)
Stable on Wellbutrin per patient. No SI/HI. Declines counseling at this time.

## 2022-02-17 ENCOUNTER — Encounter: Payer: Self-pay | Admitting: Family Medicine

## 2022-02-17 LAB — LIPID PANEL
Cholesterol: 134 mg/dL (ref 0–200)
HDL: 47.8 mg/dL
LDL Cholesterol: 69 mg/dL (ref 0–99)
NonHDL: 86.58
Total CHOL/HDL Ratio: 3
Triglycerides: 86 mg/dL (ref 0.0–149.0)
VLDL: 17.2 mg/dL (ref 0.0–40.0)

## 2022-02-17 LAB — HEMOGLOBIN A1C: Hgb A1c MFr Bld: 6.2 % (ref 4.6–6.5)

## 2022-02-17 LAB — CBC
HCT: 34.7 % — ABNORMAL LOW (ref 36.0–46.0)
Hemoglobin: 11.5 g/dL — ABNORMAL LOW (ref 12.0–15.0)
MCHC: 33.1 g/dL (ref 30.0–36.0)
MCV: 90.5 fl (ref 78.0–100.0)
Platelets: 241 K/uL (ref 150.0–400.0)
RBC: 3.83 Mil/uL — ABNORMAL LOW (ref 3.87–5.11)
RDW: 13.8 % (ref 11.5–15.5)
WBC: 8.2 K/uL (ref 4.0–10.5)

## 2022-02-17 LAB — COMPREHENSIVE METABOLIC PANEL
ALT: 23 U/L (ref 0–35)
AST: 22 U/L (ref 0–37)
Albumin: 4.3 g/dL (ref 3.5–5.2)
Alkaline Phosphatase: 105 U/L (ref 39–117)
BUN: 16 mg/dL (ref 6–23)
CO2: 30 mEq/L (ref 19–32)
Calcium: 9.7 mg/dL (ref 8.4–10.5)
Chloride: 101 mEq/L (ref 96–112)
Creatinine, Ser: 1.16 mg/dL (ref 0.40–1.20)
GFR: 51.83 mL/min — ABNORMAL LOW (ref >60.00)
Glucose, Bld: 116 mg/dL — ABNORMAL HIGH (ref 70–99)
Potassium: 4 mEq/L (ref 3.5–5.1)
Sodium: 139 mEq/L (ref 135–145)
Total Bilirubin: 0.3 mg/dL (ref 0.2–1.2)
Total Protein: 7.4 g/dL (ref 6.0–8.3)

## 2022-02-17 LAB — TSH: TSH: 0.78 u[IU]/mL (ref 0.35–5.50)

## 2022-02-20 ENCOUNTER — Telehealth: Payer: Self-pay | Admitting: Family Medicine

## 2022-02-20 DIAGNOSIS — R768 Other specified abnormal immunological findings in serum: Secondary | ICD-10-CM

## 2022-02-20 NOTE — Telephone Encounter (Signed)
Pt states ins is more likely to cover acyclovir instead of pervious medications she had taken before and would like to know if she can get an rx sent in. Please advise.   [place]   [address], [place] [address]  Phone:  [phone]  Fax:  [phone]

## 2022-02-21 NOTE — Telephone Encounter (Signed)
I would have sent this in for the pt, but I do not see where any similar Rx was sent for the pt.   Please advise.

## 2022-02-22 DIAGNOSIS — R768 Other specified abnormal immunological findings in serum: Secondary | ICD-10-CM | POA: Insufficient documentation

## 2022-02-22 HISTORY — DX: Other specified abnormal immunological findings in serum: R76.8

## 2022-02-22 MED ORDER — ACYCLOVIR 800 MG PO TABS: 800.0000 mg | ORAL_TABLET | Freq: Three times a day (TID) | ORAL | 3 refills | Status: DC

## 2022-02-22 NOTE — Telephone Encounter (Signed)
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

## 2022-02-22 NOTE — Telephone Encounter (Signed)
Pt notified of rx. 

## 2022-02-22 NOTE — Addendum Note (Signed)
Addended by: Caleen Jobs B on: 02/22/2022 12:06 PM   Modules accepted: Orders

## 2022-02-24 ENCOUNTER — Ambulatory Visit (INDEPENDENT_AMBULATORY_CARE_PROVIDER_SITE_OTHER): Payer: Commercial Managed Care - HMO | Admitting: Family Medicine

## 2022-02-24 ENCOUNTER — Encounter: Payer: Self-pay | Admitting: Family Medicine

## 2022-02-24 VITALS — BP 160/70 | Ht 73.0 in | Wt 242.0 lb

## 2022-02-24 DIAGNOSIS — M533 Sacrococcygeal disorders, not elsewhere classified: Secondary | ICD-10-CM

## 2022-02-24 HISTORY — DX: Sacrococcygeal disorders, not elsewhere classified: M53.3

## 2022-02-24 MED ORDER — MELOXICAM 15 MG PO TABS: ORAL_TABLET | ORAL | 3 refills | Status: DC

## 2022-03-07 ENCOUNTER — Other Ambulatory Visit: Payer: Self-pay | Admitting: Family Medicine

## 2022-03-08 ENCOUNTER — Other Ambulatory Visit: Payer: Self-pay

## 2022-03-08 MED ORDER — GABAPENTIN 300 MG PO CAPS: 300.0000 mg | ORAL_CAPSULE | Freq: Two times a day (BID) | ORAL | 1 refills | Status: DC

## 2022-04-03 ENCOUNTER — Ambulatory Visit (INDEPENDENT_AMBULATORY_CARE_PROVIDER_SITE_OTHER): Payer: Commercial Managed Care - HMO | Admitting: Family Medicine

## 2022-04-03 ENCOUNTER — Encounter: Payer: Self-pay | Admitting: Family Medicine

## 2022-04-03 VITALS — BP 140/68 | Ht 73.0 in | Wt 232.0 lb

## 2022-04-03 DIAGNOSIS — M47817 Spondylosis without myelopathy or radiculopathy, lumbosacral region: Secondary | ICD-10-CM

## 2022-04-03 HISTORY — DX: Spondylosis without myelopathy or radiculopathy, lumbosacral region: M47.817

## 2022-04-03 NOTE — Progress Notes (Signed)
  Denise Mejia - 61 y.o. female MRN [id number]  Date of birth: 07-Nov-1960  SUBJECTIVE:  Including CC & ROS.  No chief complaint on file.   Denise Mejia is a 61 y.o. female that is following up for her low back pain.  She has gotten improvement with the modifications that we have implemented.  Still gets soreness and stiffness from time to time.   Review of Systems See HPI   HISTORY: Past Medical, Surgical, Social, and Family History Reviewed & Updated per EMR.   Pertinent Historical Findings include:  Past Medical History:  Diagnosis Date   Anxiety    Arthritis    Bronchitis    reports hx of frequent   Depression    pt states she does not have history of depresssion 02-18-2014   Fibroid    GERD (gastroesophageal reflux disease)    Heart murmur    Hyperlipidemia    Hypertension    Sleep apnea     Past Surgical History:  Procedure Laterality Date   ABDOMINAL HYSTERECTOMY     CARPAL TUNNEL RELEASE     bilateral   INDUCED ABORTION     X2     PHYSICAL EXAM:  VS: BP 140/68 (BP Location: Left Arm, Patient Position: Sitting, Cuff Size: Normal)   Ht '6\' 1"'$  (1.854 m)   Wt 232 lb (105.2 kg)   BMI 30.61 kg/m  Physical Exam Gen: NAD, alert, cooperative with exam, well-appearing MSK:  Neurovascularly intact       ASSESSMENT & PLAN:   Facet arthropathy, lumbosacral Having improvement with modalities.  Still gets soreness and stiffness intermittently. -Counseled on home exercise therapy and supportive care. -Could consider physical therapy or further imaging.

## 2022-04-03 NOTE — Assessment & Plan Note (Signed)
Having improvement with modalities.  Still gets soreness and stiffness intermittently. -Counseled on home exercise therapy and supportive care. -Could consider physical therapy or further imaging.

## 2022-04-11 ENCOUNTER — Ambulatory Visit: Payer: Commercial Managed Care - HMO | Admitting: Family Medicine

## 2022-04-14 ENCOUNTER — Ambulatory Visit (INDEPENDENT_AMBULATORY_CARE_PROVIDER_SITE_OTHER): Payer: Commercial Managed Care - HMO | Admitting: Cardiology

## 2022-04-14 ENCOUNTER — Ambulatory Visit: Payer: Commercial Managed Care - HMO | Admitting: Family

## 2022-04-14 VITALS — BP 132/74 | Ht 73.0 in | Wt 230.1 lb

## 2022-04-14 DIAGNOSIS — I10 Essential (primary) hypertension: Secondary | ICD-10-CM

## 2022-04-14 DIAGNOSIS — R0602 Shortness of breath: Secondary | ICD-10-CM

## 2022-04-14 DIAGNOSIS — R011 Cardiac murmur, unspecified: Secondary | ICD-10-CM

## 2022-04-14 NOTE — Patient Instructions (Signed)
Medication Instructions:  Your physician recommends that you continue on your current medications as directed. Please refer to the Current Medication list given to you today.  *If you need a refill on your cardiac medications before your next appointment, please call your pharmacy*   Lab Work: None If you have labs (blood work) drawn today and your tests are completely normal, you will receive your results only by: [facility] (if you have MyChart) OR A paper copy in the mail If you have any lab test that is abnormal or we need to change your treatment, we will call you to review the results.   Testing/Procedures: Your physician has requested that you have an echocardiogram. Echocardiography is a painless test that uses sound waves to create images of your heart. It provides your doctor with information about the size and shape of your heart and how well your heart's chambers and valves are working. This procedure takes approximately one hour. There are no restrictions for this procedure.    Follow-Up: At [facility], you and your health needs are our priority.  As part of our continuing mission to provide you with exceptional heart care, we have created designated Provider Care Teams.  These Care Teams include your primary Cardiologist (physician) and Advanced Practice Providers (APPs -  Physician Assistants and Nurse Practitioners) who all work together to provide you with the care you need, when you need it.  We recommend signing up for the patient portal called "MyChart".  Sign up information is provided on this After Visit Summary.  MyChart is used to connect with patients for Virtual Visits (Telemedicine).  Patients are able to view lab/test results, encounter notes, upcoming appointments, etc.  Non-urgent messages can be sent to your provider as well.   To learn more about what you can do with MyChart, go to [email or web address].    Your next appointment:   6  month(s)  The format for your next appointment:   In Person  Provider:   Shirlee More, MD    Other Instructions Send blood pressures in 4 weeks via [facility] to measure your blood pressure correctly  To determine whether you have hypertension, a medical professional will take a blood pressure reading. How you prepare for the test, the position of your arm, and other factors can change a blood pressure reading by 10% or more. That could be enough to hide high blood pressure, start you on a drug you don't really need, or lead your doctor to incorrectly adjust your medications. National and international guidelines offer specific instructions for measuring blood pressure. If a doctor, nurse, or medical assistant isn't doing it right, don't hesitate to ask him or her to get with the guidelines. Here's what you can do to ensure a correct reading:  Don't drink a caffeinated beverage or smoke during the 30 minutes before the test.  Sit quietly for five minutes before the test begins.  During the measurement, sit in a chair with your feet on the floor and your arm supported so your elbow is at about heart level.  The inflatable part of the cuff should completely cover at least 80% of your upper arm, and the cuff should be placed on bare skin, not over a shirt.  Don't talk during the measurement.  Have your blood pressure measured twice, with a brief break in between. If the readings are different by 5  points or more, have it done a third time. There are times to break these rules. If you sometimes feel lightheaded when getting out of bed in the morning or when you stand after sitting, you should have your blood pressure checked while seated and then while standing to see if it falls from one position to the next. Because blood pressure varies throughout the day, your doctor will rarely diagnose hypertension on the basis of a single  reading. Instead, he or she will want to confirm the measurements on at least two occasions, usually within a few weeks of one another. The exception to this rule is if you have a blood pressure reading of 180/110 mm Hg or higher. A result this high usually calls for prompt treatment. It's also a good idea to have your blood pressure measured in both arms at least once, since the reading in one arm (usually the right) may be higher than that in the left. A 2014 study in The American Journal of Medicine of nearly 3,400 people found average arm- to-arm differences in systolic blood pressure of about 5 points. The higher number should be used to make treatment decisions. In 2017, new guidelines from the [employer], the [employer] of Cardiology, and nine other health organizations lowered the diagnosis of high blood pressure to 130/80 mm Hg or higher for all adults. The guidelines also redefined the various blood pressure categories to now include normal, elevated, Stage 1 hypertension, Stage 2 hypertension, and hypertensive crisis (see "Blood pressure categories"). Blood pressure categories  Blood pressure category SYSTOLIC (upper number)  DIASTOLIC (lower number)  Normal Less than 120 mm Hg and Less than 80 mm Hg  Elevated 120-129 mm Hg and Less than 80 mm Hg  High blood pressure: Stage 1 hypertension 130-139 mm Hg or 80-89 mm Hg  High blood pressure: Stage 2 hypertension 140 mm Hg or higher or 90 mm Hg or higher  Hypertensive crisis (consult your doctor immediately) Higher than 180 mm Hg and/or Higher than 120 mm Hg  Source: American Heart Association and American Stroke Association. For more on getting your blood pressure under control, buy Controlling Your Blood Pressure, a Special Health Report from [facility].

## 2022-04-14 NOTE — Progress Notes (Signed)
Cardiology Office Note:    Date:  04/14/2022   ID:  Denise Mejia, DOB 03-28-1961, MRN [id number]  PCP:  Denise Saver, NP  Cardiologist:  Denise More, MD   Referring MD: Denise Saver, NP  ASSESSMENT:    1. Essential hypertension   2. Murmur   3. SOB (shortness of breath)    PLAN:    In order of problems listed above:  Difficult to determine hypertensive control off of the single determination however at target here in the office.  We discussed good technique device she has an [employer] upper extremity digit and will use that to check her blood pressure daily and asked after several weeks to send me a list of ambulatory blood pressures.  Generally the office is higher than home.  Will no longer use a wrist device She has shortness of breath heart murmur edema will do echocardiogram to evaluate for left ventricular systolic or diastolic dysfunction.  Previous echocardiogram did not show aortic valve dysfunction.  Next appointment 6 months   Medication Adjustments/Labs and Tests Ordered: Current medicines are reviewed at length with the patient today.  Concerns regarding medicines are outlined above.  Orders Placed This Encounter  Procedures   EKG 12-Lead   ECHOCARDIOGRAM COMPLETE   No orders of the defined types were placed in this encounter.    Chief Complaint  Patient presents with   Follow-up   Hypertension  I am concerned as have had shortness of breath recently previously seen by cardiologist  History of Present Illness:    Denise Mejia is a 61 y.o. female who is being seen today for the evaluation of hypertension at the request of Denise Saver, NP.  She was seen last as an inpatient at [facility] by my associate Dr. Candee Mejia when she had chest pain from the description it sounded nonanginal and underwent a myocardial perfusion study. The images were low risk normal LV function EF 66% and normal myocardial perfusion.  She was seen by [facility]  cardiology 09/30/2019 for hypertension.  There is notation that echocardiogram in 2018 showed normal left ventricular size function at that time renal function was normal blood pressures reported at 158/80 and she was continued on her spironolactone and increased dose of amlodipine.  Another echocardiogram was performed at [facility] 08/05/2021:  Left ventricle showed mild concentric LVH normal size normal left ventricular systolic function EF 55 to 60% GLS was normal diastolic function is normal left atrium was normal in size.  She remains employed as a CMA She checks her blood pressure at home predominantly with a wrist device and generally has numbers higher than the office at times in the range of 1 89-2 60 systolic She does not have edema but she has shortness of breath intermittently with physical effort and sometimes has to stop and rest for relief no chest pain palpitation or syncope She also notices a small degree of dependent edema taking both high-dose amlodipine and gabapentin. She is concerned about chronic kidney disease GFR 52 cc/min Past Medical History:  Diagnosis Date   Anxiety    Arthritis    Bronchitis    reports hx of frequent   Depression    pt states she does not have history of depresssion 02-18-2014   Fibroid    GERD (gastroesophageal reflux disease)    Heart murmur    Hyperlipidemia    Hypertension    Sleep apnea     Past Surgical History:  Procedure Laterality  Date   ABDOMINAL HYSTERECTOMY     CARPAL TUNNEL RELEASE     bilateral   INDUCED ABORTION     X2    Current Medications: No outpatient medications have been marked as taking for the 04/14/22 encounter (Office Visit) with Denise Priest, MD.     Allergies:   Lisinopril   Social History   Socioeconomic History   Marital status: Single    Spouse name: Not on file   Number of children: 1   Years of education: Not on file   Highest education level: Not on file  Occupational History    Comment:  Nurses aide  Tobacco Use   Smoking status: Never   Smokeless tobacco: Never  Substance and Sexual Activity   Alcohol use: No   Drug use: No   Sexual activity: Not on file  Other Topics Concern   Not on file  Social History Narrative   Not on file   Social Determinants of Health   Financial Resource Strain: Not on file  Food Insecurity: Not on file  Transportation Needs: Not on file  Physical Activity: Not on file  Stress: Not on file  Social Connections: Not on file     Family History: The patient's family history includes Hypertension in her sister; Stroke in her mother.  ROS:   ROS Please see the history of present illness.     All other systems reviewed and are negative.  EKGs/Labs/Other Studies Reviewed:    The following studies were reviewed today:   EKG:  EKG is  ordered today.  The ekg ordered today is personally reviewed and demonstrates sinus rhythm nonspecific T waves  Recent Labs: 02/16/2022: ALT 23; BUN 16; Creatinine, Ser 1.16; Hemoglobin 11.5; Platelets 241.0; Potassium 4.0; Sodium 139; TSH 0.78  Recent Lipid Panel    Component Value Date/Time   CHOL 134 02/16/2022 1614   TRIG 86.0 02/16/2022 1614   HDL 47.80 02/16/2022 1614   CHOLHDL 3 02/16/2022 1614   VLDL 17.2 02/16/2022 1614   LDLCALC 69 02/16/2022 1614    Physical Exam:    VS:  BP 132/74 (BP Location: Left Arm, Patient Position: Sitting, Cuff Size: Normal)   Ht '6\' 1"'$  (1.854 m)   Wt 230 lb 1.9 oz (104.4 kg)   BMI 30.36 kg/m     Wt Readings from Last 3 Encounters:  04/14/22 230 lb 1.9 oz (104.4 kg)  04/03/22 232 lb (105.2 kg)  02/24/22 242 lb (109.8 kg)     GEN: Appears her age well nourished, well developed in no acute distress HEENT: Normal NECK: No JVD; No carotid bruits LYMPHATICS: No lymphadenopathy CARDIAC: Grade 1/6 to 2/6 systolic ejection murmur aortic area S2 normal does not radiate to the carotids and no aortic regurgitation  RESPIRATORY:  Clear to auscultation without  rales, wheezing or rhonchi  ABDOMEN: Soft, non-tender, non-distended MUSCULOSKELETAL: Trace bilateral lower extremity pitting edema; No deformity  SKIN: Warm and dry NEUROLOGIC:  Alert and oriented x 3 PSYCHIATRIC:  Normal affect     Signed, Denise More, MD  04/14/2022 12:06 PM    [place]

## 2022-04-28 ENCOUNTER — Inpatient Hospital Stay (HOSPITAL_BASED_OUTPATIENT_CLINIC_OR_DEPARTMENT_OTHER)
Admission: RE | Admit: 2022-04-28 | Discharge: 2022-04-28 | Disposition: A | Discharge status: Home / Self Care | Payer: Commercial Managed Care - HMO | Source: Ambulatory Visit | Attending: Cardiology | Admitting: Cardiology

## 2022-05-05 ENCOUNTER — Ambulatory Visit (HOSPITAL_COMMUNITY): Payer: Commercial Managed Care - HMO

## 2022-05-17 ENCOUNTER — Telehealth: Payer: Self-pay

## 2022-05-17 NOTE — Telephone Encounter (Signed)
Called patient and informed her that Dr. Bettina Gavia had reviewed her blood pressure log and no changes were needed at this time. Patient had no further questions at this time.

## 2022-05-22 ENCOUNTER — Ambulatory Visit (INDEPENDENT_AMBULATORY_CARE_PROVIDER_SITE_OTHER): Payer: Commercial Managed Care - HMO | Admitting: Family

## 2022-05-22 VITALS — BP 138/84 | HR 64 | Temp 98.2°F | Resp 16 | Wt 232.0 lb

## 2022-05-22 DIAGNOSIS — M5441 Lumbago with sciatica, right side: Secondary | ICD-10-CM | POA: Diagnosis not present

## 2022-05-22 DIAGNOSIS — R011 Cardiac murmur, unspecified: Secondary | ICD-10-CM

## 2022-05-22 DIAGNOSIS — I10 Essential (primary) hypertension: Secondary | ICD-10-CM | POA: Diagnosis not present

## 2022-05-22 DIAGNOSIS — Z1231 Encounter for screening mammogram for malignant neoplasm of breast: Secondary | ICD-10-CM | POA: Diagnosis not present

## 2022-05-22 DIAGNOSIS — F32A Depression, unspecified: Secondary | ICD-10-CM

## 2022-05-22 DIAGNOSIS — R7303 Prediabetes: Secondary | ICD-10-CM

## 2022-05-22 DIAGNOSIS — M5442 Lumbago with sciatica, left side: Secondary | ICD-10-CM

## 2022-05-22 DIAGNOSIS — G8929 Other chronic pain: Secondary | ICD-10-CM

## 2022-05-22 HISTORY — DX: Prediabetes: R73.03

## 2022-05-22 MED ORDER — BUPROPION HCL ER (XL) 150 MG PO TB24: 150.0000 mg | ORAL_TABLET | Freq: Every day | ORAL | 1 refills | Status: DC

## 2022-05-22 NOTE — Patient Instructions (Signed)
Psychiatric Services:  Dr. Chucky May [facility]) - [phone] [place] [facility]) - [phone] [place]) [phone] [place]) [phone] [place] ([place]) [phone]

## 2022-05-22 NOTE — Assessment & Plan Note (Addendum)
Could not afford to update her echo. She did have an echo performed 2022 which showed normal LVEF and no obvious valvular abnormalities.  Will continue to monitor clinically.

## 2022-05-22 NOTE — Assessment & Plan Note (Signed)
Discussed diabetic diet.

## 2022-05-22 NOTE — Assessment & Plan Note (Signed)
Improving. Continue home exercises.  Uses meloxicam occasionally.

## 2022-05-22 NOTE — Progress Notes (Signed)
Subjective:   By signing my name below, I, [name], attest that this documentation has been prepared under the direction and in the presence of Denise Chimera, NP 05/22/2022   Patient ID: Denise Mejia, female    DOB: May 15, 1961, 61 y.o.   MRN: [id number]  Chief Complaint  Patient presents with   Hypertension    Here for follow up    HPI Patient is in today for an office visit  Refills: She is requesting a refill of 150 Mg of Wellbutrin XL.  Back Pain: Her lower back pain is persistent however overall back pain symptoms are improving. She is doing home exercises to alleviate her symptoms. When symptoms worsen, she takes 15 Mg of Meloxicam.  Echocardiogram: Her cardiologist referred her to get an echocardiogram but due to costs, was not able to afford one. She occasionally becomes short of breath. Her symptoms appear when she's at work or when she's sitting at home. She uses an inhaler and sometimes it help.  Blood Pressure: She states that she has not taken her blood pressure medications prior to today's visit. As of today's visit her blood pressure reading is 138/74 BP Readings from Last 3 Encounters:  05/22/22 138/84  04/14/22 132/74  04/03/22 140/68   Pulse Readings from Last 3 Encounters:  05/22/22 64  02/16/22 90  10/31/21 82   A1C: Her A1C levels are borderline diabetes.  Lab Results  Component Value Date   HGBA1C 6.2 02/16/2022   Mood: Her mood is improving. She was previously working with a psychiatrist but does not anymore.  Mammogram: Last completed on 08/01/2021  Health Maintenance Due  Topic Date Due   Hepatitis C Screening  Never done   Zoster Vaccines- Shingrix (1 of 2) Never done   PAP SMEAR-Modifier  Never done   COLONOSCOPY (Pts 45-56yr Insurance coverage will need to be confirmed)  Never done   MAMMOGRAM  Never done   COVID-19 Vaccine (3 - Moderna risk series) 06/17/2020   TETANUS/TDAP  01/11/2021   INFLUENZA VACCINE  04/04/2022    Past  Medical History:  Diagnosis Date   Anxiety    Arthritis    Bronchitis    reports hx of frequent   Depression    pt states she does not have history of depresssion 02-18-2014   Fibroid    GERD (gastroesophageal reflux disease)    Heart murmur    Hyperlipidemia    Hypertension    Sleep apnea     Past Surgical History:  Procedure Laterality Date   ABDOMINAL HYSTERECTOMY     CARPAL TUNNEL RELEASE     bilateral   INDUCED ABORTION     X2    Family History  Problem Relation Age of Onset   Stroke Mother    Hypertension Sister     Social History   Socioeconomic History   Marital status: Single    Spouse name: Not on file   Number of children: 1   Years of education: Not on file   Highest education level: Not on file  Occupational History    Comment: Nurses aide  Tobacco Use   Smoking status: Never   Smokeless tobacco: Never  Substance and Sexual Activity   Alcohol use: No   Drug use: No   Sexual activity: Not on file  Other Topics Concern   Not on file  Social History Narrative   Not on file   Social Determinants of Health   Financial Resource Strain: Not  on file  Food Insecurity: Not on file  Transportation Needs: Not on file  Physical Activity: Not on file  Stress: Not on file  Social Connections: Not on file  Intimate Partner Violence: Not on file    Outpatient Medications Prior to Visit  Medication Sig Dispense Refill   acyclovir (ZOVIRAX) 800 MG tablet Take 1 tablet (800 mg total) by mouth 3 (three) times daily. Use for 2 days at the onset of an outbreak. 24 tablet 3   albuterol (PROVENTIL HFA;VENTOLIN HFA) 108 (90 Base) MCG/ACT inhaler Inhale 2 puffs into the lungs 4 (four) times daily as needed for wheezing or shortness of breath.      amLODipine (NORVASC) 10 MG tablet Take 10 mg by mouth daily.     aspirin EC 81 MG EC tablet Take 1 tablet (81 mg total) by mouth daily. Swallow whole. 30 tablet 11   atorvastatin (LIPITOR) 20 MG tablet Take 20 mg by  mouth daily.     esomeprazole (NEXIUM) 40 MG capsule Take 40 mg by mouth every morning.     gabapentin (NEURONTIN) 300 MG capsule Take 1 capsule (300 mg total) by mouth in the morning and at bedtime. 180 capsule 1   losartan-hydrochlorothiazide (HYZAAR) 100-25 MG tablet Take 1 tablet by mouth daily.     meloxicam (MOBIC) 15 MG tablet One tab PO qAM with breakfast for 2 weeks, then daily prn pain. 30 tablet 3   orphenadrine (NORFLEX) 100 MG tablet Take 1 tablet (100 mg total) by mouth 2 (two) times daily. 30 tablet 0   Vitamin D, Ergocalciferol, (DRISDOL) 1.25 MG (50000 UNIT) CAPS capsule Take 50,000 Units by mouth every 7 (seven) days.     No facility-administered medications prior to visit.    Allergies  Allergen Reactions   Lisinopril Cough and Other (See Comments)    cough cough cough     ROS See HPI    Objective:    Physical Exam Constitutional:      General: She is not in acute distress.    Appearance: Normal appearance. She is not ill-appearing.  HENT:     Head: Normocephalic and atraumatic.     Right Ear: External ear normal.     Left Ear: External ear normal.  Eyes:     Extraocular Movements: Extraocular movements intact.     Pupils: Pupils are equal, round, and reactive to light.  Cardiovascular:     Rate and Rhythm: Normal rate and regular rhythm.     Heart sounds: Murmur heard.     Systolic murmur is present with a grade of 3/6.     No gallop.  Pulmonary:     Effort: Pulmonary effort is normal. No respiratory distress.     Breath sounds: Normal breath sounds. No wheezing or rales.  Skin:    General: Skin is warm and dry.  Neurological:     Mental Status: She is alert and oriented to person, place, and time.  Psychiatric:        Mood and Affect: Mood normal.        Behavior: Behavior normal.        Judgment: Judgment normal.     BP 138/84   Pulse 64   Temp 98.2 F (36.8 C) (Oral)   Resp 16   Wt 232 lb (105.2 kg)   SpO2 100%   BMI 30.61 kg/m   Wt Readings from Last 3 Encounters:  05/22/22 232 lb (105.2 kg)  04/14/22 230 lb 1.9 oz (  104.4 kg)  04/03/22 232 lb (105.2 kg)       Assessment & Plan:   Problem List Items Addressed This Visit       Unprioritized   Murmur    Could not afford to update her echo. She did have an echo performed 2022 which showed normal LVEF and no obvious valvular abnormalities.  Will continue to monitor clinically.       Essential hypertension    BP Readings from Last 3 Encounters:  05/22/22 138/84  04/14/22 132/74  04/03/22 140/68  BP stable. Continue amlodipine and hyzaar.       Depression    Had previously followed with psychiatry but states she is not able to return to that practice. Requesting refill of her bupropion. I advised her to schedule a consultation with another psychiatrist and I gave her some numbers to call.       Relevant Medications   buPROPion (WELLBUTRIN XL) 150 MG 24 hr tablet   Chronic midline low back pain with bilateral sciatica    Improving. Continue home exercises.  Uses meloxicam occasionally.        Relevant Medications   buPROPion (WELLBUTRIN XL) 150 MG 24 hr tablet   Borderline diabetes    Discussed diabetic diet.       Other Visit Diagnoses     Encounter for screening mammogram for malignant neoplasm of breast    -  Primary   Relevant Orders   MM 3D SCREEN BREAST BILATERAL      Meds ordered this encounter  Medications   buPROPion (WELLBUTRIN XL) 150 MG 24 hr tablet    Sig: Take 1 tablet (150 mg total) by mouth daily.    Dispense:  90 tablet    Refill:  1    Order Specific Question:   Supervising Provider    Answer:   Penni Homans A [[id number]]    I, Nance Pear, NP, personally preformed the services described in this documentation.  All medical record entries made by the scribe were at my direction and in my presence.  I have reviewed the chart and discharge instructions (if applicable) and agree that the record reflects my personal  performance and is accurate and complete. 05/22/2022   I,Amber Collins,acting as a Education administrator for Nance Pear, NP.,have documented all relevant documentation on the behalf of Nance Pear, NP,as directed by  Nance Pear, NP while in the presence of Nance Pear, NP.    Nance Pear, NP

## 2022-05-22 NOTE — Assessment & Plan Note (Addendum)
BP Readings from Last 3 Encounters:  05/22/22 138/84  04/14/22 132/74  04/03/22 140/68   BP stable. Continue amlodipine and hyzaar.

## 2022-05-22 NOTE — Assessment & Plan Note (Signed)
Had previously followed with psychiatry but states she is not able to return to that practice. Requesting refill of her bupropion. I advised her to schedule a consultation with another psychiatrist and I gave her some numbers to call.

## 2022-05-23 ENCOUNTER — Telehealth (HOSPITAL_BASED_OUTPATIENT_CLINIC_OR_DEPARTMENT_OTHER): Payer: Self-pay

## 2022-06-26 ENCOUNTER — Ambulatory Visit (HOSPITAL_BASED_OUTPATIENT_CLINIC_OR_DEPARTMENT_OTHER)
Admission: RE | Admit: 2022-06-26 | Discharge: 2022-06-26 | Disposition: A | Discharge status: Home / Self Care | Payer: Commercial Managed Care - HMO | Source: Ambulatory Visit | Attending: Family | Admitting: Family

## 2022-06-26 ENCOUNTER — Encounter (HOSPITAL_BASED_OUTPATIENT_CLINIC_OR_DEPARTMENT_OTHER): Payer: Self-pay

## 2022-06-26 DIAGNOSIS — Z1231 Encounter for screening mammogram for malignant neoplasm of breast: Secondary | ICD-10-CM | POA: Diagnosis present

## 2022-08-15 ENCOUNTER — Ambulatory Visit: Payer: Commercial Managed Care - HMO | Admitting: Family Medicine

## 2022-08-16 ENCOUNTER — Ambulatory Visit: Payer: Commercial Managed Care - HMO | Admitting: Family Medicine

## 2022-08-21 ENCOUNTER — Ambulatory Visit (INDEPENDENT_AMBULATORY_CARE_PROVIDER_SITE_OTHER): Payer: Commercial Managed Care - HMO | Admitting: Family Medicine

## 2022-08-21 ENCOUNTER — Encounter: Payer: Self-pay | Admitting: Family Medicine

## 2022-08-21 VITALS — BP 128/51 | HR 73 | Temp 98.1°F | Resp 18 | Ht 73.0 in | Wt 228.8 lb

## 2022-08-21 DIAGNOSIS — Z1159 Encounter for screening for other viral diseases: Secondary | ICD-10-CM | POA: Diagnosis not present

## 2022-08-21 DIAGNOSIS — Z0000 Encounter for general adult medical examination without abnormal findings: Secondary | ICD-10-CM | POA: Diagnosis not present

## 2022-08-21 DIAGNOSIS — R739 Hyperglycemia, unspecified: Secondary | ICD-10-CM

## 2022-08-21 DIAGNOSIS — E559 Vitamin D deficiency, unspecified: Secondary | ICD-10-CM

## 2022-08-21 DIAGNOSIS — F419 Anxiety disorder, unspecified: Secondary | ICD-10-CM

## 2022-08-21 HISTORY — DX: Vitamin D deficiency, unspecified: E55.9

## 2022-08-21 HISTORY — DX: Hyperglycemia, unspecified: R73.9

## 2022-08-21 LAB — CBC WITH DIFFERENTIAL/PLATELET
Basophils Absolute: 0 10*3/uL (ref 0.0–0.1)
Basophils Relative: 0.7 % (ref 0.0–3.0)
Eosinophils Absolute: 0.1 10*3/uL (ref 0.0–0.7)
Eosinophils Relative: 1.9 % (ref 0.0–5.0)
HCT: 33.1 % — ABNORMAL LOW (ref 36.0–46.0)
Hemoglobin: 11.3 g/dL — ABNORMAL LOW (ref 12.0–15.0)
Lymphocytes Relative: 43.2 % (ref 12.0–46.0)
Lymphs Abs: 2.2 10*3/uL (ref 0.7–4.0)
MCHC: 34.1 g/dL (ref 30.0–36.0)
MCV: 90 fl (ref 78.0–100.0)
Monocytes Absolute: 0.3 10*3/uL (ref 0.1–1.0)
Monocytes Relative: 5.7 % (ref 3.0–12.0)
Neutro Abs: 2.4 10*3/uL (ref 1.4–7.7)
Neutrophils Relative %: 48.5 % (ref 43.0–77.0)
Platelets: 226 10*3/uL (ref 150.0–400.0)
RBC: 3.68 Mil/uL — ABNORMAL LOW (ref 3.87–5.11)
RDW: 13.4 % (ref 11.5–15.5)
WBC: 5 10*3/uL (ref 4.0–10.5)

## 2022-08-21 LAB — COMPREHENSIVE METABOLIC PANEL
ALT: 12 U/L (ref 0–35)
AST: 14 U/L (ref 0–37)
Albumin: 4.3 g/dL (ref 3.5–5.2)
Alkaline Phosphatase: 79 U/L (ref 39–117)
BUN: 11 mg/dL (ref 6–23)
CO2: 30 mEq/L (ref 19–32)
Calcium: 9.5 mg/dL (ref 8.4–10.5)
Chloride: 103 mEq/L (ref 96–112)
Creatinine, Ser: 0.93 mg/dL (ref 0.40–1.20)
GFR: 67.33 mL/min (ref >60.00)
Glucose, Bld: 89 mg/dL (ref 70–99)
Potassium: 3.9 mEq/L (ref 3.5–5.1)
Sodium: 141 mEq/L (ref 135–145)
Total Bilirubin: 0.6 mg/dL (ref 0.2–1.2)
Total Protein: 7.1 g/dL (ref 6.0–8.3)

## 2022-08-21 LAB — LIPID PANEL
Cholesterol: 142 mg/dL (ref 0–200)
HDL: 49.3 mg/dL (ref >39.00)
LDL Cholesterol: 78 mg/dL (ref 0–99)
NonHDL: 92.35
Total CHOL/HDL Ratio: 3
Triglycerides: 71 mg/dL (ref 0.0–149.0)
VLDL: 14.2 mg/dL (ref 0.0–40.0)

## 2022-08-21 LAB — VITAMIN D 25 HYDROXY (VIT D DEFICIENCY, FRACTURES): VITD: 54.53 ng/mL (ref 30.00–100.00)

## 2022-08-21 LAB — HEMOGLOBIN A1C: Hgb A1c MFr Bld: 6.3 % (ref 4.6–6.5)

## 2022-08-21 MED ORDER — HYDROXYZINE PAMOATE 25 MG PO CAPS: 25.0000 mg | ORAL_CAPSULE | Freq: Three times a day (TID) | ORAL | 0 refills | Status: DC | PRN

## 2022-08-21 NOTE — Assessment & Plan Note (Signed)
+  PHQ9/GAD7 Adding hydroxyzine as she only wants something PRN for now. Education provided Referral placed No SI/HI

## 2022-08-21 NOTE — Patient Instructions (Signed)
Adding hydroxyzine for your anxiety. See information sheet attached. Referral also placed.

## 2022-08-21 NOTE — Progress Notes (Signed)
Complete physical exam  Patient: Denise Mejia   DOB: 1961-02-24   61 y.o. Female  MRN: [id number]  Subjective:    Chief Complaint  Patient presents with   Annual Exam    Denise Mejia is a 61 y.o. female who presents today for a complete physical exam. She reports consuming a general diet. The patient does not participate in regular exercise at present. She generally feels fairly well. She reports sleeping poorly. She does have additional problems to discuss today.   Anxiety: Patient reports her anxiety has been significantly worse the past 2 weeks. She cannot think of any specific triggers. Racing thoughts are making her have a hard time sleeping. Denies any worsening depression, but does feel more easily irritated. She denies any SI/HI. She would like a referral for counseling and is open to medications.   Most recent fall risk assessment:    08/21/2022    9:13 AM  [place] in the past year? 0  Number falls in past yr: 0  Injury with Fall? 0  Risk for fall due to : No Fall Risks  Follow up Falls evaluation completed     Most recent depression screenings:    08/21/2022    9:13 AM 05/22/2022   10:53 AM  PHQ 2/9 Scores  PHQ - 2 Score 5 5  PHQ- 9 Score 19 19       08/21/2022    9:13 AM 05/22/2022   10:53 AM  PHQ9 SCORE ONLY  PHQ-9 Total Score 19 19      08/21/2022    9:14 AM 05/22/2022   10:53 AM  GAD 7 : Generalized Anxiety Score  Nervous, Anxious, on Edge 3 2  Control/stop worrying 3 3  Worry too much - different things 3 3  Trouble relaxing 3 2  Restless 1 2  Easily annoyed or irritable 3 3  Afraid - awful might happen 1 3  Total GAD 7 Score 17 18  Anxiety Difficulty Somewhat difficult Somewhat difficult       Vision:Within last year and Dental: No current dental problems and No regular dental care     Patient Care Team: Terrilyn Saver, NP as PCP - General (Family Medicine)   Outpatient Medications Prior to Visit  Medication Sig    acyclovir (ZOVIRAX) 800 MG tablet Take 1 tablet (800 mg total) by mouth 3 (three) times daily. Use for 2 days at the onset of an outbreak.   albuterol (PROVENTIL HFA;VENTOLIN HFA) 108 (90 Base) MCG/ACT inhaler Inhale 2 puffs into the lungs 4 (four) times daily as needed for wheezing or shortness of breath.    amLODipine (NORVASC) 10 MG tablet Take 10 mg by mouth daily.   aspirin EC 81 MG EC tablet Take 1 tablet (81 mg total) by mouth daily. Swallow whole.   atorvastatin (LIPITOR) 20 MG tablet Take 20 mg by mouth daily.   buPROPion (WELLBUTRIN XL) 150 MG 24 hr tablet Take 1 tablet (150 mg total) by mouth daily.   gabapentin (NEURONTIN) 300 MG capsule Take 1 capsule (300 mg total) by mouth in the morning and at bedtime.   losartan-hydrochlorothiazide (HYZAAR) 100-25 MG tablet Take 1 tablet by mouth daily.   Vitamin D, Ergocalciferol, (DRISDOL) 1.25 MG (50000 UNIT) CAPS capsule Take 50,000 Units by mouth every 7 (seven) days.   esomeprazole (NEXIUM) 40 MG capsule Take 40 mg by mouth every morning. (Patient not taking: Reported on 08/21/2022)   meloxicam (MOBIC) 15 MG tablet  One tab PO qAM with breakfast for 2 weeks, then daily prn pain. (Patient not taking: Reported on 08/21/2022)   orphenadrine (NORFLEX) 100 MG tablet Take 1 tablet (100 mg total) by mouth 2 (two) times daily. (Patient not taking: Reported on 08/21/2022)   No facility-administered medications prior to visit.    ROS All review of systems negative except what is listed in the HPI        Objective:     BP (!) 128/51 (BP Location: Left Arm, Patient Position: Sitting, Cuff Size: Large)   Pulse 73   Temp 98.1 F (36.7 C) (Oral)   Resp 18   Ht '6\' 1"'$  (1.854 m)   Wt 228 lb 12.8 oz (103.8 kg)   SpO2 100%   BMI 30.19 kg/m    Physical Exam Vitals reviewed.  Constitutional:      General: She is not in acute distress.    Appearance: Normal appearance. She is not ill-appearing.  HENT:     Head: Normocephalic and atraumatic.      Right Ear: Tympanic membrane normal.     Left Ear: Tympanic membrane normal.     Nose: Nose normal.     Mouth/Throat:     Mouth: Mucous membranes are moist.     Pharynx: Oropharynx is clear.  Eyes:     Extraocular Movements: Extraocular movements intact.     Conjunctiva/sclera: Conjunctivae normal.     Pupils: Pupils are equal, round, and reactive to light.  Neck:     Vascular: No carotid bruit.  Cardiovascular:     Rate and Rhythm: Normal rate and regular rhythm.     Pulses: Normal pulses.     Heart sounds: Normal heart sounds.  Pulmonary:     Effort: Pulmonary effort is normal.     Breath sounds: Normal breath sounds.  Abdominal:     General: Abdomen is flat. Bowel sounds are normal. There is no distension.     Palpations: Abdomen is soft. There is no mass.     Tenderness: There is no abdominal tenderness. There is no right CVA tenderness, left CVA tenderness, guarding or rebound.  Genitourinary:    Comments: Deferred exam Musculoskeletal:        General: Normal range of motion.     Cervical back: Normal range of motion and neck supple. No tenderness.     Right lower leg: No edema.     Left lower leg: No edema.  Lymphadenopathy:     Cervical: No cervical adenopathy.  Skin:    General: Skin is warm and dry.     Capillary Refill: Capillary refill takes less than 2 seconds.  Neurological:     General: No focal deficit present.     Mental Status: She is alert and oriented to person, place, and time. Mental status is at baseline.  Psychiatric:        Mood and Affect: Mood normal.        Behavior: Behavior normal.        Thought Content: Thought content normal.        Judgment: Judgment normal.         No results found for any visits on 08/21/22.     Assessment & Plan:    Routine Health Maintenance and Physical Exam   There is no immunization history on file for this patient.  Health Maintenance  Topic Date Due   Hepatitis C Screening  Never done    DTaP/Tdap/Td (1 - Tdap) Never done  Zoster Vaccines- Shingrix (1 of 2) Never done   PAP SMEAR-Modifier  Never done   COLONOSCOPY (Pts 45-24yr Insurance coverage will need to be confirmed)  Never done   COVID-19 Vaccine (3 - Moderna risk series) 06/17/2020   INFLUENZA VACCINE  04/04/2022   MAMMOGRAM  06/26/2024   HIV Screening  Completed   HPV VACCINES  Aged Out    Discussed health benefits of physical activity, and encouraged her to engage in regular exercise appropriate for her age and condition.  Problem List Items Addressed This Visit       Other   Anxiety    +PHQ9/GAD7 Adding hydroxyzine as she only wants something PRN for now. Education provided Referral placed No SI/HI      Relevant Medications   hydrOXYzine (VISTARIL) 25 MG capsule   Other Relevant Orders   Ambulatory referral to Behavioral Health   Vitamin D deficiency   Relevant Orders   Vitamin D (25 hydroxy)   Hyperglycemia   Relevant Orders   Hemoglobin A1c   Other Visit Diagnoses     Annual physical exam    -  Primary   Relevant Orders   Lipid panel   CBC with Differential/Platelet   Comprehensive metabolic panel   Hemoglobin A1c   Hepatitis C antibody   Encounter for hepatitis C screening test for low risk patient       Relevant Orders   Hepatitis C antibody      Return in about 6 months (around 02/20/2023) for routine follow-up.     TTerrilyn Saver NP

## 2022-08-22 ENCOUNTER — Telehealth: Payer: Self-pay | Admitting: Family Medicine

## 2022-08-22 LAB — HEPATITIS C ANTIBODY: Hepatitis C Ab: NONREACTIVE

## 2022-08-22 NOTE — Telephone Encounter (Signed)
Patient called to follow up on referral for counselor. Please call patient to advise.

## 2022-08-23 NOTE — Telephone Encounter (Signed)
Spoke with pt and she said she will wait until they call

## 2022-08-24 ENCOUNTER — Other Ambulatory Visit: Payer: Self-pay

## 2022-08-24 MED ORDER — LOSARTAN POTASSIUM-HCTZ 100-25 MG PO TABS: 1.0000 | ORAL_TABLET | Freq: Every day | ORAL | 0 refills | Status: DC

## 2022-09-01 ENCOUNTER — Other Ambulatory Visit: Payer: Self-pay

## 2022-09-01 ENCOUNTER — Telehealth: Payer: Self-pay | Admitting: Family Medicine

## 2022-09-01 MED ORDER — AMLODIPINE BESYLATE 10 MG PO TABS: 10.0000 mg | ORAL_TABLET | Freq: Every day | ORAL | 1 refills | Status: DC

## 2022-09-01 NOTE — Telephone Encounter (Signed)
Prescription Request  09/01/2022  Is this a "Controlled Substance" medicine? No  LOV: 08/21/2022  What is the name of the medication or equipment?   amLODipine (NORVASC) 10 MG tablet [[id number]]   Have you contacted your pharmacy to request a refill? Yes   Which pharmacy would you like this sent to?  [place] [address] Phone: [phone] Fax: [phone]    Patient notified that their request is being sent to the clinical staff for review and that they should receive a response within 2 business days.   Please advise at Mobile [phone] (mobile)

## 2022-09-03 ENCOUNTER — Ambulatory Visit
Admission: EM | Admit: 2022-09-03 | Discharge: 2022-09-03 | Disposition: A | Discharge status: Home / Self Care | Payer: Managed Care, Other (non HMO) | Attending: Emergency Medicine | Admitting: Emergency Medicine

## 2022-09-03 ENCOUNTER — Ambulatory Visit
Admission: EM | Admit: 2022-09-03 | Discharge: 2022-09-03 | Disposition: A | Discharge status: Home / Self Care | Payer: Commercial Managed Care - HMO | Attending: Emergency Medicine | Admitting: Emergency Medicine

## 2022-09-03 DIAGNOSIS — J209 Acute bronchitis, unspecified: Secondary | ICD-10-CM

## 2022-09-03 DIAGNOSIS — K2100 Gastro-esophageal reflux disease with esophagitis, without bleeding: Secondary | ICD-10-CM | POA: Insufficient documentation

## 2022-09-03 DIAGNOSIS — Z79899 Other long term (current) drug therapy: Secondary | ICD-10-CM | POA: Insufficient documentation

## 2022-09-03 DIAGNOSIS — J309 Allergic rhinitis, unspecified: Secondary | ICD-10-CM | POA: Diagnosis not present

## 2022-09-03 DIAGNOSIS — R262 Difficulty in walking, not elsewhere classified: Secondary | ICD-10-CM | POA: Insufficient documentation

## 2022-09-03 DIAGNOSIS — R062 Wheezing: Secondary | ICD-10-CM | POA: Diagnosis not present

## 2022-09-03 DIAGNOSIS — Z1152 Encounter for screening for COVID-19: Secondary | ICD-10-CM | POA: Diagnosis not present

## 2022-09-03 DIAGNOSIS — B349 Viral infection, unspecified: Secondary | ICD-10-CM | POA: Diagnosis not present

## 2022-09-03 DIAGNOSIS — R0602 Shortness of breath: Secondary | ICD-10-CM | POA: Diagnosis not present

## 2022-09-03 MED ORDER — FLUTICASONE PROPIONATE 50 MCG/ACT NA SUSP: 1.0000 | Freq: Every day | NASAL | 1 refills | Status: DC

## 2022-09-03 MED ORDER — AEROCHAMBER PLUS FLO-VU LARGE MISC
1.0000 | Freq: Once | 0 refills | Status: AC
Start: 2022-09-03 — End: 2022-09-03

## 2022-09-03 MED ORDER — ESOMEPRAZOLE MAGNESIUM 40 MG PO CPDR: 40.0000 mg | DELAYED_RELEASE_CAPSULE | Freq: Every day | ORAL | 1 refills | Status: DC

## 2022-09-03 MED ORDER — SUCRALFATE 1 GM/10ML PO SUSP
1.0000 g | Freq: Three times a day (TID) | ORAL | 0 refills | Status: DC
Start: 2022-09-03 — End: 2023-01-08

## 2022-09-03 MED ORDER — PROMETHAZINE-DM 6.25-15 MG/5ML PO SYRP: 5.0000 mL | ORAL_SOLUTION | Freq: Every evening | ORAL | 0 refills | Status: DC | PRN

## 2022-09-03 MED ORDER — ALBUTEROL SULFATE (2.5 MG/3ML) 0.083% IN NEBU
2.5000 mg | INHALATION_SOLUTION | Freq: Once | RESPIRATORY_TRACT | Status: AC
  Administered 2022-09-03: 2.5 mg via RESPIRATORY_TRACT

## 2022-09-03 MED ORDER — GUAIFENESIN 400 MG PO TABS: ORAL_TABLET | ORAL | 0 refills | Status: DC

## 2022-09-03 MED ORDER — CETIRIZINE HCL 10 MG PO TABS: 10.0000 mg | ORAL_TABLET | Freq: Every day | ORAL | 1 refills | Status: DC

## 2022-09-03 MED ORDER — METHYLPREDNISOLONE SODIUM SUCC 125 MG IJ SOLR
80.0000 mg | Freq: Once | INTRAMUSCULAR | Status: AC
  Administered 2022-09-03: 80 mg via INTRAMUSCULAR

## 2022-09-03 MED ORDER — ALBUTEROL SULFATE HFA 108 (90 BASE) MCG/ACT IN AERS: 2.0000 | INHALATION_SPRAY | Freq: Four times a day (QID) | RESPIRATORY_TRACT | 1 refills | Status: DC | PRN

## 2022-09-03 NOTE — ED Provider Notes (Addendum)
[facility]    CSN: [id number] Arrival date & time: 09/03/22  0802    HISTORY   Chief Complaint  Patient presents with   Wheezing   Nasal Congestion   Gastroesophageal Reflux   HPI Denise Mejia is a pleasant, 61 y.o. female who presents to urgent care today. Patient complains of a 5-day history of congestion in her chest, intermittently feeling hot and cold and now hears wheezing.  Patient states she also has worsening acid reflux at this time, patient was last prescribed Nexium in June 2023, states she is not taking it because her primary care provider told her she could light over-the-counter.  Patient states the over-the-counter version does not work so she is not taking it at all..  Patient states she has been taking Tylenol without meaningful relief of her symptoms.  Patient reports a history of frequent episodes of bronchitis as well.  Patient has normal vital signs on arrival today.  The history is provided by the patient.   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   Obesity (BMI 30-39.9) 02/16/2022   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 Authorizing Provider  acyclovir (ZOVIRAX) 800 MG tablet Take 1 tablet (800 mg total) by mouth 3 (three) times daily. Use for 2 days at the onset of an outbreak. 02/22/22   Terrilyn Saver, NP  albuterol (PROVENTIL HFA;VENTOLIN HFA) 108 (90 Base) MCG/ACT inhaler Inhale 2 puffs into the lungs 4 (four) times daily as needed for wheezing or shortness of breath.  02/18/14   [provider]  amLODipine (NORVASC) 10 MG tablet Take 1 tablet (10 mg total) by mouth daily. 09/01/22   Terrilyn Saver, NP  aspirin EC 81 MG EC tablet Take 1 tablet (81 mg total) by mouth daily. Swallow whole. 04/04/20   Aline August, MD  atorvastatin (LIPITOR) 20 MG tablet Take 20 mg by mouth daily. 02/16/20  [provider]  buPROPion (WELLBUTRIN XL) 150 MG 24 hr tablet Take 1 tablet (150 mg total) by mouth daily. 05/22/22   Debbrah Alar, NP  esomeprazole (NEXIUM) 40 MG capsule Take 40 mg by mouth every morning. Patient not taking: Reported on 08/21/2022 02/12/22   [provider]  gabapentin (NEURONTIN) 300 MG capsule Take 1 capsule (300 mg total) by mouth in the morning and at bedtime. 03/08/22   Terrilyn Saver, NP  hydrOXYzine (VISTARIL) 25 MG capsule Take 1 capsule (25 mg total) by mouth every 8 (eight) hours as needed. 08/21/22   Terrilyn Saver, NP  losartan-hydrochlorothiazide (HYZAAR) 100-25 MG tablet Take 1 tablet by mouth daily. 08/24/22   Terrilyn Saver, NP  meloxicam (MOBIC) 15 MG tablet One tab PO qAM with breakfast for 2 weeks, then daily prn pain. Patient not taking: Reported on 08/21/2022 02/24/22   Rosemarie Ax, MD  orphenadrine (NORFLEX) 100 MG  tablet Take 1 tablet (100 mg total) by mouth 2 (two) times daily. Patient not taking: Reported on 08/21/2022 12/13/17   Charlesetta Shanks, MD  Vitamin D, Ergocalciferol, (DRISDOL) 1.25 MG (50000 UNIT) CAPS capsule Take 50,000 Units by mouth every 7 (seven) days.    [provider]    Family History Family History  Problem Relation Age of Onset   Stroke Mother    Hypertension Sister    Social History Social History   Tobacco Use   Smoking status: Never   Smokeless tobacco: Never  Substance Use Topics   Alcohol use: No   Drug use: No   Allergies   Lisinopril  Review of Systems Review of Systems Pertinent findings revealed after performing a 14 point review of systems has been noted in the history of present illness.  Physical Exam Triage Vital Signs ED Triage Vitals  Enc Vitals Group     BP 07/01/21 0827 (!) 147/82     Pulse Rate 07/01/21 0827 72     Resp 07/01/21 0827 18     Temp 07/01/21 0827 98.3 F (36.8 C)     Temp Source 07/01/21 0827 Oral     SpO2 07/01/21 0827 98 %     Weight --      Height --      Head Circumference --      Peak Flow --      Pain Score 07/01/21 0826 5     Pain Loc --      Pain Edu? --      Excl. in [place]? --   No data found.  Updated Vital Signs BP (!) 153/83 (BP Location: Right Arm)   Pulse 89   Temp 98.2 F (36.8 C) (Oral)   Resp 16   SpO2 97%   Physical Exam Vitals and nursing note reviewed.  Constitutional:      General: She is not in acute distress.    Appearance: Normal appearance. She is not ill-appearing.  HENT:     Head: Normocephalic and atraumatic.     Salivary Glands: Right salivary gland is not diffusely enlarged or tender. Left salivary gland is not diffusely enlarged or tender.     Right Ear: Ear canal and external ear normal. No drainage. A middle ear effusion is present. There is no impacted cerumen. Tympanic membrane is bulging. Tympanic membrane is not injected or erythematous.     Left Ear: Ear canal and  external ear normal. No drainage. A middle ear effusion is present. There is no impacted cerumen.  Tympanic membrane is bulging. Tympanic membrane is not injected or erythematous.     Ears:     Comments: Bilateral EACs normal, both TMs bulging with clear fluid    Nose: Rhinorrhea present. No nasal deformity, septal deviation, signs of injury, nasal tenderness, mucosal edema or congestion. Rhinorrhea is clear.     Right Nostril: Occlusion present. No foreign body, epistaxis or septal hematoma.     Left Nostril: Occlusion present. No foreign body, epistaxis or septal hematoma.     Right Turbinates: Enlarged, swollen and pale.     Left Turbinates: Enlarged, swollen and pale.     Right Sinus: No maxillary sinus tenderness or frontal sinus tenderness.     Left Sinus: No maxillary sinus tenderness or frontal sinus tenderness.     Mouth/Throat:     Lips: Pink. No lesions.     Mouth: Mucous membranes are moist. No oral lesions.     Pharynx: Oropharynx is clear. Uvula midline. No posterior oropharyngeal erythema or uvula swelling.     Tonsils: No tonsillar exudate. 0 on the right. 0 on the left.     Comments: Postnasal drip Eyes:     General: Lids are normal.        Right eye: No discharge.        Left eye: No discharge.     Extraocular Movements: Extraocular movements intact.     Conjunctiva/sclera: Conjunctivae normal.     Right eye: Right conjunctiva is not injected.     Left eye: Left conjunctiva is not injected.  Neck:     Trachea: Trachea and phonation normal.  Cardiovascular:     Rate and Rhythm: Normal rate and regular rhythm.     Pulses: Normal pulses.     Heart sounds: Normal heart sounds. No murmur heard.    No friction rub. No gallop.  Pulmonary:     Effort: Pulmonary effort is normal. Prolonged expiration present. No tachypnea, bradypnea, accessory muscle usage, respiratory distress or retractions.     Breath sounds: No stridor, decreased air movement or transmitted upper airway  sounds. Examination of the right-upper field reveals wheezing. Examination of the left-upper field reveals wheezing. Examination of the right-middle field reveals rhonchi. Examination of the left-middle field reveals rhonchi. Examination of the right-lower field reveals rhonchi. Examination of the left-lower field reveals rhonchi. Wheezing and rhonchi present. No decreased breath sounds or rales.  Chest:     Chest wall: No tenderness.  Musculoskeletal:        General: Normal range of motion.     Cervical back: Normal range of motion and neck supple. Normal range of motion.  Lymphadenopathy:     Cervical: No cervical adenopathy.  Skin:    General: Skin is warm and dry.     Findings: No erythema or rash.  Neurological:     General: No focal deficit present.     Mental Status: She is alert and oriented to person, place, and time.  Psychiatric:        Mood and Affect: Mood normal.        Behavior: Behavior normal.     Visual Acuity Right Eye Distance:   Left Eye Distance:   Bilateral Distance:    Right Eye Near:   Left Eye Near:    Bilateral Near:     UC Couse / Diagnostics / Procedures:     Radiology No results found.  Procedures Procedures (including critical care time) EKG  Pending results:  Labs Reviewed - No  data to display  Medications Ordered in UC: Medications  methylPREDNISolone sodium succinate (SOLU-MEDROL) 125 mg/2 mL injection 80 mg (80 mg Intramuscular Given 09/03/22 0848)  albuterol (PROVENTIL) (2.5 MG/3ML) 0.083% nebulizer solution 2.5 mg (2.5 mg Nebulization Given 09/03/22 0848)    UC Diagnoses / Final Clinical Impressions(s)   I have reviewed the triage vital signs and the nursing notes.  Pertinent labs & imaging results that were available during my care of the patient were reviewed by me and considered in my medical decision making (see chart for details).    Final diagnoses:  Gastroesophageal reflux disease with esophagitis without hemorrhage   Acute bronchitis, unspecified organism  Wheezing  Allergic rhinitis, unspecified seasonality, unspecified trigger   Patient had improvement of work of breathing and significant improvement of breath sounds after nebulized albuterol treatment and injection of Solu-Medrol.  Patient provided with prescriptions for albuterol and a spacer, allergy medications and supportive medications including Mucinex to promote expectoration and Promethazine DM for nighttime cough.  Patient provided with a renewal of Nexium 40 mg as this is not available over-the-counter. Please see discharge instructions below for further details of plan of care as provided to patient. ED Prescriptions     Medication Sig Dispense Auth. Provider   cetirizine (ZYRTEC ALLERGY) 10 MG tablet Take 1 tablet (10 mg total) by mouth at bedtime. 90 tablet Lynden Oxford Scales, PA-C   fluticasone (FLONASE) 50 MCG/ACT nasal spray Place 1 spray into both nostrils daily. 47.4 mL Lynden Oxford Scales, PA-C   albuterol (VENTOLIN HFA) 108 (90 Base) MCG/ACT inhaler Inhale 2 puffs into the lungs every 6 (six) hours as needed for wheezing or shortness of breath (Cough). 54 g Lynden Oxford Scales, PA-C   Spacer/Aero-Holding Chambers (AEROCHAMBER PLUS FLO-VU LARGE) MISC 1 each by Other route once for 1 dose. 1 each Lynden Oxford Scales, PA-C   esomeprazole (NEXIUM) 40 MG capsule Take 1 capsule (40 mg total) by mouth daily at 12 noon. 90 capsule Lynden Oxford Scales, PA-C   guaifenesin (HUMIBID E) 400 MG TABS tablet Take 1 tablet 3 times daily as needed for chest congestion and cough 21 tablet Lynden Oxford Scales, PA-C   promethazine-dextromethorphan (PROMETHAZINE-DM) 6.25-15 MG/5ML syrup Take 5 mLs by mouth at bedtime as needed for cough. 60 mL Lynden Oxford Scales, PA-C   sucralfate (CARAFATE) 1 GM/10ML suspension Take 10 mLs (1 g total) by mouth 4 (four) times daily -  with meals and at bedtime. Separate doses of Carafate and Nexium by 2  hours 420 mL Lynden Oxford Scales, PA-C      PDMP not reviewed this encounter.  Disposition Upon Discharge:  Condition: stable for discharge home Home: take medications as prescribed; routine discharge instructions as discussed; follow up as advised.  Patient presented with an acute illness with associated systemic symptoms and significant discomfort requiring urgent management. In my opinion, this is a condition that a prudent lay person (someone who possesses an average knowledge of health and medicine) may potentially expect to result in complications if not addressed urgently such as respiratory distress, impairment of bodily function or dysfunction of bodily organs.   Routine symptom specific, illness specific and/or disease specific instructions were discussed with the patient and/or caregiver at length.   As such, the patient has been evaluated and assessed, work-up was performed and treatment was provided in alignment with urgent care protocols and evidence based medicine.  Patient/parent/caregiver has been advised that the patient may require follow up for further testing and treatment if  the symptoms continue in spite of treatment, as clinically indicated and appropriate.  If the patient was tested for COVID-19, Influenza and/or RSV, then the patient/parent/guardian was advised to isolate at home pending the results of his/her diagnostic coronavirus test and potentially longer if they're positive. I have also advised pt that if his/her COVID-19 test returns positive, it's recommended to self-isolate for at least 10 days after symptoms first appeared AND until fever-free for 24 hours without fever reducer AND other symptoms have improved or resolved. Discussed self-isolation recommendations as well as instructions for household member/close contacts as per the [facility] and [place] DHHS, and also gave patient the [place] packet with this information.  Patient/parent/caregiver has been advised to  return to the [facility] or PCP in 3-5 days if no better; to PCP or the Emergency Department if new signs and symptoms develop, or if the current signs or symptoms continue to change or worsen for further workup, evaluation and treatment as clinically indicated and appropriate  The patient will follow up with their current PCP if and as advised. If the patient does not currently have a PCP we will assist them in obtaining one.   The patient may need specialty follow up if the symptoms continue, in spite of conservative treatment and management, for further workup, evaluation, consultation and treatment as clinically indicated and appropriate.  Patient/parent/caregiver verbalized understanding and agreement of plan as discussed.  All questions were addressed during visit.  Please see discharge instructions below for further details of plan.  Discharge Instructions:   Discharge Instructions      You received a COVID-19 PCR test today.  The result of your COVID-19 test will be posted to your MyChart once it is complete, typically this takes 24 to 36 hours.      If your COVID-19 PCR test is positive, you will be contacted by phone.  Please discuss with the callback nurse whether or not you would benefit from antiviral therapy treatment for COVID-19.     If your COVID-19 PCR test is negative, please consider retesting in the next 2 to 3 days, particularly if you are not feeling any better.  You are welcome to return here to urgent care to have it done or you can take a home COVID-19 test.    If both your COVID-19 tests are negative, then you can safely assume that your illness is due to one of the many less serious illnesses circulating in our community right now.  Conservative care is recommended with rest, drinking plenty of clear fluids, eating only when hungry, taking supportive medications for your symptoms and avoiding being around other people.  Please remain at home until you are fever free for 24  hours without the use of antifever medications such as Tylenol and ibuprofen.     Based on my physical exam findings and the history you have provided  today, I do not recommend antibiotics at this time.  I do not believe the risks and side effects of antibiotics would outweigh any minimal benefit that they might provide.       Please read below to learn more about the medications, dosages and frequencies that I recommend to help alleviate your symptoms of allergic rhinitis and bronchitis and to get you feeling better soon:   Solu-Medrol IM (methylprednisolone):  To quickly address your significant respiratory inflammation, you were provided with an injection of Solu-Medrol in the office today.  You should continue to feel the full benefit of the steroid for  the next 4 to 6 hours.    Zyrtec (cetirizine): This is an excellent second-generation antihistamine that helps to reduce respiratory inflammatory response to environmental allergens.  In some patients, this medication can cause daytime sleepiness so I recommend that you take 1 tablet daily at bedtime.     Flonase (fluticasone): This is a steroid nasal spray that you use once daily, 1 spray in each nare.  This medication does not work well if you decide to use it only used as you feel you need to, it works best used on a daily basis.  After 3 to 5 days of use, you will notice significant reduction of the inflammation and mucus production that is currently being caused by exposure to allergens, whether seasonal or environmental.  The most common side effect of this medication is nosebleeds.  If you experience a nosebleed, please discontinue use for 1 week, then feel free to resume.  I have provided you with a prescription.     ProAir, Ventolin, Proventil (albuterol): This inhaled medication contains a short acting beta agonist bronchodilator.  This medication works on the smooth muscle that opens and constricts of your airways by relaxing the muscle.   The result of relaxation of the smooth muscle is increased air movement and improved work of breathing.  This is a short acting medication that can be used every 4-6 hours as needed for increased work of breathing, shortness of breath, wheezing and excessive coughing.  I have provided you with a prescription.    Robitussin, Mucinex (guaifenesin): This is an expectorant.  This helps break up chest congestion and loosen up thick nasal drainage making phlegm and drainage more liquid and therefore easier to remove.  I recommend being 400 mg three times daily as needed.      Promethazine DM: Promethazine is both a nasal decongestant and an antinausea medication that makes most patients feel fairly sleepy.  The DM is dextromethorphan, a cough suppressant found in many over-the-counter cough medications.  Please take 5 mL before bedtime to minimize your cough which will help you sleep better.  I have sent a prescription for this medication to your pharmacy.   Please follow-up within the next 5-7 days either with your primary care provider or urgent care if your symptoms do not resolve.  If you do not have a primary care provider, we will assist you in finding one.        Thank you for visiting urgent care today.  We appreciate the opportunity to participate in your care.       This office note has been dictated using Museum/gallery curator.  Unfortunately, this method of dictation can sometimes lead to typographical or grammatical errors.  I apologize for your inconvenience in advance if this occurs.  Please do not hesitate to reach out to me if clarification is needed.      Lynden Oxford Scales, PA-C 09/03/22 [place], [place], PA-C 09/03/22 [phone]

## 2022-09-03 NOTE — ED Triage Notes (Signed)
Pt states since Wednesday she has had chest congestion, has been feeling hot/cold and now hears wheezing. The patient states it feels like she has acid reflux also.   Home interventions: tylenol

## 2022-09-03 NOTE — ED Notes (Signed)
Added on board to add order.

## 2022-09-03 NOTE — Discharge Instructions (Addendum)
You received a COVID-19 PCR test today.  The result of your COVID-19 test will be posted to your MyChart once it is complete, typically this takes 24 to 36 hours.      If your COVID-19 PCR test is positive, you will be contacted by phone.  Please discuss with the callback nurse whether or not you would benefit from antiviral therapy treatment for COVID-19.     If your COVID-19 PCR test is negative, please consider retesting in the next 2 to 3 days, particularly if you are not feeling any better.  You are welcome to return here to urgent care to have it done or you can take a home COVID-19 test.    If both your COVID-19 tests are negative, then you can safely assume that your illness is due to one of the many less serious illnesses circulating in our community right now.  Conservative care is recommended with rest, drinking plenty of clear fluids, eating only when hungry, taking supportive medications for your symptoms and avoiding being around other people.  Please remain at home until you are fever free for 24 hours without the use of antifever medications such as Tylenol and ibuprofen.     Based on my physical exam findings and the history you have provided  today, I do not recommend antibiotics at this time.  I do not believe the risks and side effects of antibiotics would outweigh any minimal benefit that they might provide.       Please read below to learn more about the medications, dosages and frequencies that I recommend to help alleviate your symptoms of allergic rhinitis and bronchitis and to get you feeling better soon:   Solu-Medrol IM (methylprednisolone):  To quickly address your significant respiratory inflammation, you were provided with an injection of Solu-Medrol in the office today.  You should continue to feel the full benefit of the steroid for the next 4 to 6 hours.    Zyrtec (cetirizine): This is an excellent second-generation antihistamine that helps to reduce respiratory  inflammatory response to environmental allergens.  In some patients, this medication can cause daytime sleepiness so I recommend that you take 1 tablet daily at bedtime.     Flonase (fluticasone): This is a steroid nasal spray that you use once daily, 1 spray in each nare.  This medication does not work well if you decide to use it only used as you feel you need to, it works best used on a daily basis.  After 3 to 5 days of use, you will notice significant reduction of the inflammation and mucus production that is currently being caused by exposure to allergens, whether seasonal or environmental.  The most common side effect of this medication is nosebleeds.  If you experience a nosebleed, please discontinue use for 1 week, then feel free to resume.  I have provided you with a prescription.     ProAir, Ventolin, Proventil (albuterol): This inhaled medication contains a short acting beta agonist bronchodilator.  This medication works on the smooth muscle that opens and constricts of your airways by relaxing the muscle.  The result of relaxation of the smooth muscle is increased air movement and improved work of breathing.  This is a short acting medication that can be used every 4-6 hours as needed for increased work of breathing, shortness of breath, wheezing and excessive coughing.  I have provided you with a prescription.    Robitussin, Mucinex (guaifenesin): This is an expectorant.  This helps  break up chest congestion and loosen up thick nasal drainage making phlegm and drainage more liquid and therefore easier to remove.  I recommend being 400 mg three times daily as needed.      Promethazine DM: Promethazine is both a nasal decongestant and an antinausea medication that makes most patients feel fairly sleepy.  The DM is dextromethorphan, a cough suppressant found in many over-the-counter cough medications.  Please take 5 mL before bedtime to minimize your cough which will help you sleep better.  I have  sent a prescription for this medication to your pharmacy.   Please follow-up within the next 5-7 days either with your primary care provider or urgent care if your symptoms do not resolve.  If you do not have a primary care provider, we will assist you in finding one.        Thank you for visiting urgent care today.  We appreciate the opportunity to participate in your care.

## 2022-09-04 LAB — SARS CORONAVIRUS 2 (TAT 6-24 HRS): SARS Coronavirus 2: NEGATIVE

## 2022-09-16 ENCOUNTER — Other Ambulatory Visit: Payer: Self-pay | Admitting: Family Medicine

## 2022-09-19 ENCOUNTER — Telehealth: Payer: Self-pay | Admitting: Family Medicine

## 2022-09-19 ENCOUNTER — Other Ambulatory Visit: Payer: Self-pay

## 2022-09-19 MED ORDER — ATORVASTATIN CALCIUM 20 MG PO TABS: 20.0000 mg | ORAL_TABLET | Freq: Every day | ORAL | 0 refills | Status: DC

## 2022-09-19 NOTE — Telephone Encounter (Signed)
Patient requesting refill on atorvastatin. Pharmacy sent request but only saw a request for gabapentin which was filled. Patient is looking to refill her cholesterol medicine that starts with an "A." Atorvastatin hasn't been filled since 2021 based on med list but she patient said she has been getting them throughout 2023. Please review and refill cholesterol medication. [facility] [address]

## 2022-09-21 ENCOUNTER — Encounter: Payer: Self-pay | Admitting: General Practice

## 2022-09-21 ENCOUNTER — Other Ambulatory Visit: Payer: Self-pay | Admitting: Family Medicine

## 2022-09-21 DIAGNOSIS — R768 Other specified abnormal immunological findings in serum: Secondary | ICD-10-CM

## 2022-10-19 ENCOUNTER — Encounter: Payer: Self-pay | Admitting: Family Medicine

## 2022-10-19 ENCOUNTER — Ambulatory Visit (INDEPENDENT_AMBULATORY_CARE_PROVIDER_SITE_OTHER): Payer: Commercial Managed Care - HMO | Admitting: Family Medicine

## 2022-10-19 VITALS — BP 132/62 | HR 79 | Temp 98.6°F | Ht 73.2 in | Wt 220.4 lb

## 2022-10-19 DIAGNOSIS — R519 Headache, unspecified: Secondary | ICD-10-CM | POA: Diagnosis not present

## 2022-10-19 MED ORDER — KETOROLAC TROMETHAMINE 30 MG/ML IJ SOLN
30.0000 mg | Freq: Once | INTRAMUSCULAR | Status: AC
  Administered 2022-10-19: 30 mg via INTRAMUSCULAR

## 2022-10-19 NOTE — Progress Notes (Signed)
Acute Office Visit  Subjective:     Patient ID: Denise Mejia, female    DOB: 1961-07-26, 62 y.o.   MRN: [id number]  Chief Complaint  Patient presents with   Headache       Headache: Patient complains of headache. She does have a headache at this time.   Patient reports she has been having some left sided headaches intermittently for the past few weeks. States this has happened in the past. She has had 2 this month, but they were almost daily in January when she was also suffering with URI symptoms. States last week she forgot to take her blood pressure meds and ended up with a terrible headache, but after getting home, taking her BP meds and tylenol and resting, headache improved. She now has a dull 3-4/10 headache, mostly left sided, but denies any current sinus pressure or rhinorrhea/nasal congestion, though she did have some last week. States she has not noticed any triggers or patterns for headaches. Reports last time she was in the ED/UC for a bad headache she got Tordaol and that helped. She tries not to take regular NSAIDs because she is afraid of getting a stomach ulcer, but when she gets these headaches, she usually gets relief with tylenol or OTC sinus meds. She denies any associated nausea, vomiting, dizziness, vision changes, photo/phonophobia, weakness, slurred speech, "worst headache of life," etc.    Description of Headaches: Location of pain: left-sided unilateral Radiation of pain?:none Character of pain:aching Severity of pain: 3-4/10 Accompanying symptoms: none Prodromal sx?: none Rapidity of onset: gradual Typical duration of individual headache: several hours Are most headaches similar in presentation? yes Typical precipitants: none Frequency: Usually only a couple times per month, but was almost daily in January (reports several URI symptoms that month), only about once a week so far this month. Currently no other URI symptoms.   Temporal Pattern of  Headaches: Started having HAs 1 year ago Worst time of day: varies, sometimes she wakes up and notices it, sometimes develops later in the day Awaken from sleep?: no Seasonal pattern?: no 'Clustering' of HAs over time? no Overall pattern since problem began: unchanged,   Degree of Functional Impairment: mild  Current Use of Meds to Treat HA: Abortive meds? OTC sinus/allergy med, unsure what she took exactly Daily use? no Prophylactic meds? none  Additional Relevant History: History of head/neck trauma? no History of head/neck surgery? no Family h/o headache problems? no Use of meds that might worsen HAs? no Exposure to carbon monoxide? no Substance use: none      All review of systems negative except what is listed in the HPI      Objective:    BP 132/62   Pulse 79   Temp 98.6 F (37 C)   Ht 6' 1.2" (1.859 m)   Wt 220 lb 6.4 oz (100 kg)   SpO2 100%   BMI 28.92 kg/m    Physical Exam Vitals reviewed.  Constitutional:      General: She is not in acute distress.    Appearance: She is well-developed. She is not ill-appearing.  HENT:     Head: Normocephalic and atraumatic.  Eyes:     General: No visual field deficit.    Extraocular Movements: Extraocular movements intact.     Pupils: Pupils are equal, round, and reactive to light. Pupils are equal.  Cardiovascular:     Rate and Rhythm: Normal rate and regular rhythm.  Pulmonary:     Effort: Pulmonary effort  is normal.     Breath sounds: Normal breath sounds.  Musculoskeletal:        General: Normal range of motion.     Cervical back: Normal range of motion and neck supple. No rigidity.  Lymphadenopathy:     Cervical: No cervical adenopathy.  Skin:    General: Skin is warm and dry.  Neurological:     Mental Status: She is alert.     Cranial Nerves: No cranial nerve deficit, dysarthria or facial asymmetry.     Motor: No weakness.     Coordination: Coordination normal.     Gait: Gait normal.   Psychiatric:        Mood and Affect: Mood normal.        Speech: Speech normal.        Behavior: Behavior normal.        No results found for any visits on 10/19/22.      Assessment & Plan:   Problem List Items Addressed This Visit   None Visit Diagnoses     Acute nonintractable headache, unspecified headache type    -  Primary   Relevant Medications   ketorolac (TORADOL) 30 MG/ML injection 30 mg (Completed)     Shot of Toradol today to help your discomfort Recommend keeping a headache diary and avoiding any triggers Supportive measures discussed and added to AVS  No red flags today. Patient aware of signs/symptoms requiring further/urgent evaluation.   Please contact office for follow-up if symptoms do not improve or worsen. Seek emergency care if symptoms become severe.      Meds ordered this encounter  Medications   ketorolac (TORADOL) 30 MG/ML injection 30 mg    Return if symptoms worsen or fail to improve.  Terrilyn Saver, NP

## 2022-10-19 NOTE — Patient Instructions (Signed)
Shot of Toradol today to help your discomfort Recommend keeping a headache diary and avoiding any triggers Supportive measures discussed and added to AVS   Please contact office for follow-up if symptoms do not improve or worsen. Seek emergency care if symptoms become severe.

## 2022-10-31 ENCOUNTER — Telehealth: Payer: Self-pay | Admitting: Family Medicine

## 2022-10-31 DIAGNOSIS — R768 Other specified abnormal immunological findings in serum: Secondary | ICD-10-CM

## 2022-10-31 NOTE — Telephone Encounter (Signed)
Prescription Request  10/31/2022  Is this a "Controlled Substance" medicine? No  LOV: 10/19/2022  What is the name of the medication or equipment?   acyclovir (ZOVIRAX) 800 MG tablet [id number]   Have you contacted your pharmacy to request a refill? No   Which pharmacy would you like this sent to?  [facility] [address] Phone: [phone] Fax: [phone]    Patient notified that their request is being sent to the clinical staff for review and that they should receive a response within 2 business days.   Please advise at Mobile [phone] (mobile)

## 2022-11-01 ENCOUNTER — Other Ambulatory Visit: Payer: Self-pay | Admitting: Family Medicine

## 2022-11-01 MED ORDER — ACYCLOVIR 800 MG PO TABS: ORAL_TABLET | ORAL | 0 refills | Status: DC

## 2022-11-01 NOTE — Telephone Encounter (Signed)
Pt notified that rx has been sent in.

## 2022-11-09 ENCOUNTER — Telehealth: Payer: Self-pay

## 2022-11-09 DIAGNOSIS — R011 Cardiac murmur, unspecified: Secondary | ICD-10-CM

## 2022-11-09 DIAGNOSIS — I10 Essential (primary) hypertension: Secondary | ICD-10-CM

## 2022-11-09 NOTE — Telephone Encounter (Signed)
Pt called needing a referral to cardiology upstairs (previously saw Dr. Bettina Gavia but he moved back to [place]). States she is not having any new concerns, she is just needing a check up. Referral placed.

## 2022-11-16 ENCOUNTER — Other Ambulatory Visit: Payer: Self-pay | Admitting: Family

## 2022-11-20 ENCOUNTER — Encounter: Payer: Self-pay | Admitting: Obstetrics and Gynecology

## 2022-11-20 ENCOUNTER — Ambulatory Visit (INDEPENDENT_AMBULATORY_CARE_PROVIDER_SITE_OTHER): Payer: Commercial Managed Care - HMO | Admitting: Obstetrics and Gynecology

## 2022-11-20 VITALS — BP 136/65 | HR 70 | Ht 73.0 in | Wt 227.0 lb

## 2022-11-20 DIAGNOSIS — Z01419 Encounter for gynecological examination (general) (routine) without abnormal findings: Secondary | ICD-10-CM

## 2022-11-20 DIAGNOSIS — Z0000 Encounter for general adult medical examination without abnormal findings: Secondary | ICD-10-CM

## 2022-11-20 NOTE — Progress Notes (Signed)
ANNUAL EXAM Patient name: Denise Mejia MRN [id number]  Date of birth: 11/03/1960 Chief Complaint:   Gynecologic Exam  History of Present Illness:   Denise Mejia is a 62 y.o. [id number] being seen today for a routine annual exam.  Current complaints: questions about menopause  Menstrual concerns? No   Breast or nipple changes? No  Contraception use? No  Sexually active? No  Wondering where she stands in terms of menopause Hot flashes - not as bad as they were but remain present; does't feel like she needs to do anything. Noticed improvement when started on gabapentin Vaginal dryness - present, not currently using; tried coconut oil just stopped using   No LMP recorded. Patient has had a hysterectomy.   The pregnancy intention screening data noted above was reviewed. Potential methods of contraception were discussed. The patient elected to proceed with No data recorded.   Last pap No results found for: "DIAGPAP", "Lamar", "ADEQPAP" - s/p hysterectomy Last mammogram: 06/2022. BIRADS 1 Last colonoscopy: 11/2017.      08/21/2022    9:13 AM 05/22/2022   10:53 AM  Depression screen PHQ 2/9  Decreased Interest 3 3  Down, Depressed, Hopeless 2 2  PHQ - 2 Score 5 5  Altered sleeping 3 3  Tired, decreased energy 3 3  Change in appetite 3 2  Feeling bad or failure about yourself  2 3  Trouble concentrating 3 3  Moving slowly or fidgety/restless 0 0  Suicidal thoughts 0 0  PHQ-9 Score 19 19  Difficult doing work/chores Somewhat difficult Somewhat difficult        08/21/2022    9:14 AM 05/22/2022   10:53 AM  GAD 7 : Generalized Anxiety Score  Nervous, Anxious, on Edge 3 2  Control/stop worrying 3 3  Worry too much - different things 3 3  Trouble relaxing 3 2  Restless 1 2  Easily annoyed or irritable 3 3  Afraid - awful might happen 1 3  Total GAD 7 Score 17 18  Anxiety Difficulty Somewhat difficult Somewhat difficult     Review of Systems:   Pertinent items are noted in  HPI Denies any headaches, blurred vision, fatigue, shortness of breath, chest pain, abdominal pain, abnormal vaginal discharge/itching/odor/irritation, problems with periods, bowel movements, urination, or intercourse unless otherwise stated above. Pertinent History Reviewed:  Reviewed past medical,surgical, social and family history.  Reviewed problem list, medications and allergies. Physical Assessment:   Vitals:   11/20/22 1005  BP: 136/65  Pulse: 70  Weight: 227 lb (103 kg)  Height: 6\' 1"  (1.854 m)  Body mass index is 29.95 kg/m.        Physical Examination:   General appearance - well appearing, and in no distress  Mental status - alert, oriented to person, place, and time  Psych:  She has a normal mood and affect  Skin - warm and dry, normal color, no suspicious lesions noted  Chest - effort normal, all lung fields clear to auscultation bilaterally  Heart - normal rate and regular rhythm  Breasts - declined  Abdomen - soft, nontender, nondistended, no masses or organomegaly  Pelvic - declined  Extremities:  No swelling or varicosities noted  Chaperone present for exam  No results found for this or any previous visit (from the past 24 hour(s)).    Assessment & Plan:  1. Well woman exam (no gynecological exam) Not currently interested in other medication for hot flashes, sufficient improvement with gabapentin Discussed options for vaginal dryness  including coconut oil, hyaluronic acid, and vaginal estrogen  Reviewed that given prior pelvic US could not see ovaries, having signs of hypo-estrogenic state, reassuring that she is firmly menopausal Recommend use of lubrication with intercourse and 20-40 min foreplay prior to penetrative intercourse   No orders of the defined types were placed in this encounter.   Meds: No orders of the defined types were placed in this encounter.   Follow-up: No follow-ups on file.  Darliss Cheney, MD 11/20/2022 10:20 AM

## 2022-12-06 ENCOUNTER — Telehealth: Payer: Self-pay | Admitting: Family Medicine

## 2022-12-06 DIAGNOSIS — R768 Other specified abnormal immunological findings in serum: Secondary | ICD-10-CM

## 2022-12-06 MED ORDER — ACYCLOVIR 800 MG PO TABS
800.0000 mg | ORAL_TABLET | Freq: Every day | ORAL | 1 refills | Status: DC
Start: 2022-12-06 — End: 2024-03-24

## 2022-12-06 NOTE — Telephone Encounter (Signed)
Resent RX with updated directions.

## 2022-12-06 NOTE — Telephone Encounter (Signed)
Pt would like to know why her acyclovir was discontinued.

## 2022-12-06 NOTE — Telephone Encounter (Signed)
This was sent today, not discontinued. Patient made aware.   She wants to know if we can send refills. She states she takes one tablet daily (has for a long time) and it is not just for outbreaks. Okay to send for daily? She states she has not had an outbreak in a long time.

## 2022-12-06 NOTE — Addendum Note (Signed)
Addended byAnnamaria Helling on: 12/06/2022 04:33 PM   Modules accepted: Orders

## 2022-12-18 ENCOUNTER — Encounter: Payer: Self-pay | Admitting: *Deleted

## 2022-12-28 ENCOUNTER — Telehealth: Payer: Self-pay | Admitting: Family Medicine

## 2022-12-28 NOTE — Telephone Encounter (Signed)
Called patient, she states she would like medication to help calm her down. She states she feels angry and anxious. She is taking Wellbutrin 150 XL, but feels no benefit. Appt made for next Tuesday to discuss. Ladona Ridgel - FYI.

## 2022-12-28 NOTE — Telephone Encounter (Signed)
Pt requesting call back about her medications.

## 2023-01-02 ENCOUNTER — Ambulatory Visit: Payer: Commercial Managed Care - HMO | Admitting: Family Medicine

## 2023-01-02 NOTE — Progress Notes (Deleted)
   Established Patient Office Visit  Subjective   Patient ID: Chiyo Fay, female    DOB: 06/03/1961  Age: 62 y.o. MRN: [id number]  No chief complaint on file.   HPI   Mood follow-up: - Diagnosis: anxiety - Treatment:  - Medication side effects:  - SI/HI:  - Update: In December 2023, patient wanted to start with only PRN medication so we started as needed hydroxyzine and placed a referral for counseling, but she did not schedule when they called her. Lately ***     {History (Optional):23778}  ROS    Objective:     There were no vitals taken for this visit. {Vitals History (Optional):23777}  Physical Exam   No results found for any visits on 01/02/23.  {Labs (Optional):23779}  The 10-year ASCVD risk score (Arnett DK, et al., 2019) is: 6%    Assessment & Plan:   Problem List Items Addressed This Visit   None   No follow-ups on file.    Clayborne Dana, NP

## 2023-01-08 ENCOUNTER — Ambulatory Visit: Payer: Commercial Managed Care - HMO | Admitting: Cardiology

## 2023-01-08 ENCOUNTER — Other Ambulatory Visit: Payer: Self-pay | Admitting: Family Medicine

## 2023-01-08 ENCOUNTER — Encounter: Payer: Self-pay | Admitting: Cardiology

## 2023-02-02 ENCOUNTER — Ambulatory Visit: Payer: Commercial Managed Care - HMO | Admitting: Cardiology

## 2023-02-06 ENCOUNTER — Ambulatory Visit (INDEPENDENT_AMBULATORY_CARE_PROVIDER_SITE_OTHER): Payer: Commercial Managed Care - HMO | Admitting: Family Medicine

## 2023-02-06 ENCOUNTER — Encounter: Payer: Self-pay | Admitting: Family Medicine

## 2023-02-06 VITALS — BP 134/59 | HR 68 | Ht 73.0 in | Wt 226.0 lb

## 2023-02-06 DIAGNOSIS — R1013 Epigastric pain: Secondary | ICD-10-CM | POA: Diagnosis not present

## 2023-02-06 DIAGNOSIS — K219 Gastro-esophageal reflux disease without esophagitis: Secondary | ICD-10-CM | POA: Diagnosis not present

## 2023-02-06 MED ORDER — ESOMEPRAZOLE MAGNESIUM 40 MG PO CPDR
40.0000 mg | DELAYED_RELEASE_CAPSULE | Freq: Every day | ORAL | 1 refills | Status: DC
Start: 2023-02-06 — End: 2023-09-04

## 2023-02-06 NOTE — Addendum Note (Signed)
Addended by: Mervin Kung A on: 02/06/2023 08:25 AM   Modules accepted: Orders

## 2023-02-06 NOTE — Progress Notes (Signed)
Acute Office Visit  Subjective:     Patient ID: Denise Mejia, female    DOB: [birth year], 62 y.o.   MRN: [id number]  Chief Complaint  Patient presents with   GI Problem       Discussed the use of AI scribe software for clinical note transcription with the patient, who gave verbal consent to proceed.  History of Present Illness   The patient, with a history of acid reflux managed with Nexium, presents with a two-week history of constant, gnawing, burning epigastric pain and increased burping. The discomfort was so severe that they were unable to eat and attempted to alleviate the pain with water. Over-the-counter remedies and apple cider vinegar provided temporary relief. The patient admits to non-compliance with Nexium for approximately one month due to running out of medication. They deny any recent dietary changes, nausea, vomiting, diarrhea, constipation, and blood in stool. The last bowel movement was normal. The patient denies any upper chest pain. The pain has resolved since the use of apple cider vinegar.            ROS All review of systems negative except what is listed in the HPI      Objective:    BP (!) 134/59   Pulse 68   Ht 6\' 1"  (1.854 m)   Wt 226 lb (102.5 kg)   SpO2 100%   BMI 29.82 kg/m    Physical Exam Vitals reviewed.  Constitutional:      Appearance: Normal appearance.  Cardiovascular:     Rate and Rhythm: Normal rate and regular rhythm.     Pulses: Normal pulses.     Heart sounds: Normal heart sounds.  Pulmonary:     Effort: Pulmonary effort is normal.     Breath sounds: Normal breath sounds.  Abdominal:     General: Bowel sounds are normal. There is no distension.     Palpations: Abdomen is soft. There is no mass.     Tenderness: There is no guarding or rebound.     Comments: Mild epigastric discomfort to palpation   Skin:    General: Skin is warm and dry.  Neurological:     Mental Status: She is alert and oriented to person, place,  and time.  Psychiatric:        Mood and Affect: Mood normal.        Behavior: Behavior normal.        Thought Content: Thought content normal.        Judgment: Judgment normal.     No results found for any visits on 02/06/23.      Assessment & Plan:   Problem List Items Addressed This Visit     GERD (gastroesophageal reflux disease) - Primary   Relevant Medications   esomeprazole (NEXIUM) 40 MG capsule   Other Relevant Orders   Ambulatory referral to Gastroenterology   H. pylori breath test   Other Visit Diagnoses     Epigastric pain       Relevant Medications   esomeprazole (NEXIUM) 40 MG capsule   Other Relevant Orders   Ambulatory referral to Gastroenterology   H. pylori breath test          Gastroesophageal Reflux Disease (GERD): Severe, constant epigastric burning pain for two weeks, unresponsive to over-the-counter remedies. Pain resolved after apple cider vinegar. Patient had been off Nexium for a month. -Resume Nexium. -Perform H. Pylori breath test today to rule out peptic ulcer disease. -Refer to Gastroenterology for further  evaluation.  -Patient declined additional labs today  Lifestyle measures for reflux: - Avoid meals or carbonated beverages within 3 hours of bedtime - Minimize intake of fried, fatty, and spicy foods (this will help decrease gastric acid production) - Raise the head of the bed using 4-6 inch blocks (especially if symptoms are present at night) - Maintain a healthy weight and avoid tight fitting clothes, especially around the waist  - Avoid foods that relax the sphincter or worsen symptoms (chocolate, peppermint, fatty foods, citrus, spicy foods, tomatoes, coffee, caffeine)  - Minimize use of NSAIDs (ibuprofen, Aleve, etc), nicotine, and alcohol         Meds ordered this encounter  Medications   esomeprazole (NEXIUM) 40 MG capsule    Sig: Take 1 capsule (40 mg total) by mouth daily at 12 noon.    Dispense:  90 capsule     Refill:  1    Return if symptoms worsen or fail to improve.  Clayborne Dana, NP

## 2023-02-06 NOTE — Patient Instructions (Signed)
Refilling your Nexium We can do H pylori testing today  Referral to GI at your request  Lifestyle measures for reflux: - Avoid meals or carbonated beverages within 3 hours of bedtime - Minimize intake of fried, fatty, and spicy foods (this will help decrease gastric acid production) - Raise the head of the bed using 4-6 inch blocks (especially if symptoms are present at night) - Maintain a healthy weight and avoid tight fitting clothes, especially around the waist  - Avoid foods that relax the sphincter or worsen symptoms (chocolate, peppermint, fatty foods, citrus, spicy foods, tomatoes, coffee, caffeine)  - Minimize use of NSAIDs (ibuprofen, Aleve, etc), nicotine, and alcohol

## 2023-02-07 ENCOUNTER — Other Ambulatory Visit: Payer: Commercial Managed Care - HMO

## 2023-02-14 ENCOUNTER — Other Ambulatory Visit: Payer: Self-pay | Admitting: Family Medicine

## 2023-02-15 ENCOUNTER — Other Ambulatory Visit: Payer: Self-pay | Admitting: Family Medicine

## 2023-02-19 ENCOUNTER — Telehealth: Payer: Self-pay | Admitting: Family Medicine

## 2023-02-19 NOTE — Telephone Encounter (Signed)
Patient called and would like GI referral sent somewhere else, she states she has been trying to call to schedule an appt and has not be able to get a hold of anyone.

## 2023-02-19 NOTE — Telephone Encounter (Signed)
Please send GI referral to another office.

## 2023-02-20 ENCOUNTER — Telehealth: Payer: Self-pay | Admitting: Family Medicine

## 2023-02-20 DIAGNOSIS — Z111 Encounter for screening for respiratory tuberculosis: Secondary | ICD-10-CM

## 2023-02-20 NOTE — Telephone Encounter (Signed)
Xray ordered per Ladona Ridgel. Patient made aware.

## 2023-02-20 NOTE — Telephone Encounter (Signed)
Tried to call patient to make her aware that this referral has been sent to [facility]. No answer. VM not set up.

## 2023-02-20 NOTE — Telephone Encounter (Signed)
Pt states she has to get a chest x-ray every  year for tb because she had a positive test one year. She is now overdue and her working is requiring it.

## 2023-02-20 NOTE — Telephone Encounter (Signed)
Okay to order?

## 2023-02-20 NOTE — Telephone Encounter (Signed)
Patient made aware.

## 2023-02-26 ENCOUNTER — Ambulatory Visit (HOSPITAL_BASED_OUTPATIENT_CLINIC_OR_DEPARTMENT_OTHER)
Admission: RE | Admit: 2023-02-26 | Discharge: 2023-02-26 | Disposition: A | Discharge status: Home / Self Care | Payer: Commercial Managed Care - HMO | Source: Ambulatory Visit | Attending: Family Medicine | Admitting: Family Medicine

## 2023-02-26 ENCOUNTER — Other Ambulatory Visit (INDEPENDENT_AMBULATORY_CARE_PROVIDER_SITE_OTHER): Payer: Commercial Managed Care - HMO

## 2023-02-26 DIAGNOSIS — Z111 Encounter for screening for respiratory tuberculosis: Secondary | ICD-10-CM | POA: Insufficient documentation

## 2023-02-26 DIAGNOSIS — R1013 Epigastric pain: Secondary | ICD-10-CM

## 2023-02-26 DIAGNOSIS — K219 Gastro-esophageal reflux disease without esophagitis: Secondary | ICD-10-CM | POA: Diagnosis not present

## 2023-02-28 ENCOUNTER — Encounter: Payer: Self-pay | Admitting: Family Medicine

## 2023-02-28 ENCOUNTER — Telehealth: Payer: Self-pay | Admitting: Family Medicine

## 2023-02-28 NOTE — Telephone Encounter (Signed)
Pt called stating that she was waiting for her xray results to come back for her employment. After reviewing chart, advised that radiologist had not read the images yet and we were waiting for that to happen before Ladona Ridgel can look into those. Pt acknowledged understanding but reiterated that this is mainly for her employment and she needs them back when possible. Advised a note would be sent back to see if anything could be done regarding this matter.

## 2023-02-28 NOTE — Telephone Encounter (Signed)
Chest Xray is for TB screen yearly, no symptoms. Can't really speed up reading as urgent.  Will get back to her as soon as it is read.

## 2023-03-02 ENCOUNTER — Other Ambulatory Visit: Payer: Self-pay | Admitting: Family Medicine

## 2023-03-02 LAB — H. PYLORI BREATH TEST: H. pylori Breath Test: NOT DETECTED

## 2023-03-09 ENCOUNTER — Other Ambulatory Visit: Payer: Self-pay | Admitting: Family Medicine

## 2023-03-13 ENCOUNTER — Telehealth: Payer: Self-pay | Admitting: Family Medicine

## 2023-03-13 NOTE — Telephone Encounter (Signed)
Patient made aware RX was sent in June. She will contact pharmacy and talk with a person.

## 2023-03-13 NOTE — Telephone Encounter (Signed)
Prescription Request  03/13/2023  Is this a "Controlled Substance" medicine? No  LOV: 02/06/2023  What is the name of the medication or equipment? losartan-hydrochlorothiazide (HYZAAR) 100-25 MG tablet [[id number]]   Have you contacted your pharmacy to request a refill? Yes   Which pharmacy would you like this sent to?  [id number] - [place], [place] - [address] [place] [address] Phone: [phone] Fax: [phone]    Patient notified that their request is being sent to the clinical staff for review and that they should receive a response within 2 business days.   Please advise at [facility] [phone]

## 2023-03-20 ENCOUNTER — Telehealth: Payer: Self-pay | Admitting: Gastroenterology

## 2023-03-20 NOTE — Telephone Encounter (Signed)
Good afternoon Dr. Myrtie Neither  The following patient is being referred to [place] for acid reflux and epigastric pain. She has previous GI history with digestive health and no longer wishes to continue care with them because they do not accept her insurance, [employer]. Records are available on Epic, Care Everywhere. Please review and advise of scheduling.

## 2023-03-20 NOTE — Telephone Encounter (Signed)
Next available new patient appointment.  Since we are 2 to 3 months off of those appointments, she should also consider contacting other GI practices in the area to see if they have sooner availability.  HD

## 2023-03-29 ENCOUNTER — Encounter: Payer: Self-pay | Admitting: Gastroenterology

## 2023-04-06 ENCOUNTER — Other Ambulatory Visit: Payer: Self-pay | Admitting: Family Medicine

## 2023-04-11 NOTE — Progress Notes (Deleted)
Cardiology Office Note:    Date:  04/11/2023   ID:  Denise Mejia, DOB Sep 18, 1960, MRN [id number]  PCP:  Clayborne Dana, NP  Cardiologist:  Norman Herrlich, MD    Referring MD: Clayborne Dana, NP    ASSESSMENT:    No diagnosis found. PLAN:    In order of problems listed above:  ***   Next appointment: ***   Medication Adjustments/Labs and Tests Ordered: Current medicines are reviewed at length with the patient today.  Concerns regarding medicines are outlined above.  No orders of the defined types were placed in this encounter.  No orders of the defined types were placed in this encounter.    History of Present Illness:    Denise Mejia is a 62 y.o. female with a hx of hypertension shortness of breath and heart murmur last seen 04/14/2022 echocardiogram was advised not performed.  Previous echocardiogram with [facility] January 2021 and December 2022 reported as showing normal left ventricular size function and no valvular abnormality Compliance with diet, lifestyle and medications: *** Past Medical History:  Diagnosis Date   Anxiety    Arthritis    Bronchitis    reports hx of frequent   Depression    pt states she does not have history of depresssion 02-18-2014   Fibroid    GERD (gastroesophageal reflux disease)    Heart murmur    Hyperlipidemia    Hypertension    Sleep apnea     Current Medications: No outpatient medications have been marked as taking for the 04/16/23 encounter (Appointment) with Baldo Daub, MD.      EKGs/Labs/Other Studies Reviewed:    The following studies were reviewed today:  Cardiac Studies & Procedures     STRESS TESTS  NM MYOCAR MULTI W/SPECT W 04/04/2020  Narrative CLINICAL DATA:  Nonspecific chest pain. Shortness of breath on exertion.  EXAM: MYOCARDIAL IMAGING WITH SPECT (REST AND PHARMACOLOGIC-STRESS)  GATED LEFT VENTRICULAR WALL MOTION STUDY  LEFT VENTRICULAR EJECTION FRACTION  TECHNIQUE: Standard myocardial  SPECT imaging was performed after resting intravenous injection of 10 mCi Tc-7m tetrofosmin. Subsequently, intravenous infusion of Lexiscan was performed under the supervision of the Cardiology staff. At peak effect of the drug, 30 mCi Tc-67m tetrofosmin was injected intravenously and standard myocardial SPECT imaging was performed. Quantitative gated imaging was also performed to evaluate left ventricular wall motion, and estimate left ventricular ejection fraction.  COMPARISON:  Chest radiograph-04/03/2020  FINDINGS: Raw images: There is a minimal amount of GI attenuation, worse on provided stress images. No significant patient motion artifact. No significant breast or chest wall attenuation.  Perfusion: There is a minimal amount of attenuation involving the apex of the left ventricle which resolves on the provided stress images and is without associated regional wall motion abnormality. No scintigraphic evidence of infarction or pharmacologically induced ischemia.  Wall Motion: Normal left ventricular wall motion. No left ventricular dilation.  Left Ventricular Ejection Fraction: 66 %  End diastolic volume 102 ml  End systolic volume 35 ml  IMPRESSION: 1. No scintigraphic evidence of prior infarction or pharmacologically induced ischemia.  2. Normal left ventricular wall motion.  3. Left ventricular ejection fraction 66%   Electronically Signed By: Simonne Come M.D. On: 04/04/2020 12:20                  Recent Labs: 08/21/2022: ALT 12; BUN 11; Creatinine, Ser 0.93; Hemoglobin 11.3; Platelets 226.0; Potassium 3.9; Sodium 141  Recent Lipid Panel    Component  Value Date/Time   CHOL 142 08/21/2022 0909   TRIG 71.0 08/21/2022 0909   HDL 49.30 08/21/2022 0909   CHOLHDL 3 08/21/2022 0909   VLDL 14.2 08/21/2022 0909   LDLCALC 78 08/21/2022 0909    Physical Exam:    VS:  There were no vitals taken for this visit.    Wt Readings from Last 3 Encounters:   02/06/23 226 lb (102.5 kg)  01/08/23 227 lb (103 kg)  11/20/22 227 lb (103 kg)     GEN: *** Well nourished, well developed in no acute distress HEENT: Normal NECK: No JVD; No carotid bruits LYMPHATICS: No lymphadenopathy CARDIAC: ***RRR, no murmurs, rubs, gallops RESPIRATORY:  Clear to auscultation without rales, wheezing or rhonchi  ABDOMEN: Soft, non-tender, non-distended MUSCULOSKELETAL:  No edema; No deformity  SKIN: Warm and dry NEUROLOGIC:  Alert and oriented x 3 PSYCHIATRIC:  Normal affect    Signed, Norman Herrlich, MD  04/11/2023 10:13 AM    [facility]

## 2023-04-12 DIAGNOSIS — G4730 Sleep apnea, unspecified: Secondary | ICD-10-CM | POA: Insufficient documentation

## 2023-04-12 DIAGNOSIS — E785 Hyperlipidemia, unspecified: Secondary | ICD-10-CM | POA: Insufficient documentation

## 2023-04-12 DIAGNOSIS — M1990 Unspecified osteoarthritis, unspecified site: Secondary | ICD-10-CM | POA: Insufficient documentation

## 2023-04-12 DIAGNOSIS — I10 Essential (primary) hypertension: Secondary | ICD-10-CM | POA: Insufficient documentation

## 2023-04-16 ENCOUNTER — Ambulatory Visit: Payer: Commercial Managed Care - HMO | Admitting: Cardiology

## 2023-04-30 ENCOUNTER — Encounter: Payer: Self-pay | Admitting: Family Medicine

## 2023-05-01 ENCOUNTER — Telehealth: Payer: Self-pay | Admitting: Family Medicine

## 2023-05-01 ENCOUNTER — Ambulatory Visit (INDEPENDENT_AMBULATORY_CARE_PROVIDER_SITE_OTHER): Payer: Commercial Managed Care - HMO

## 2023-05-01 DIAGNOSIS — Z23 Encounter for immunization: Secondary | ICD-10-CM | POA: Diagnosis not present

## 2023-05-01 DIAGNOSIS — Z0184 Encounter for antibody response examination: Secondary | ICD-10-CM

## 2023-05-01 NOTE — Telephone Encounter (Signed)
The patient is scheduled for a flu vaccine today at 2 p.m. and would also like to receive the Tdap vaccine. If possible, please arrange for the Tdap vaccine to be administered at the same time as her flu shot. The patient understands that because this request is made close to the appointment time, there's no guaranteed that the Tdap vaccine can be ordered and prepared before 2 p.m.

## 2023-05-01 NOTE — Telephone Encounter (Signed)
FYI okay to give TDAP with Flu vaccine.

## 2023-05-01 NOTE — Progress Notes (Signed)
Denise Mejia is a 62 y.o. female presents to the office today for Influenza and Tdap injections, per physician's orders. Original order: 05/01/2023 Flulaval 0.5 mL IM was administered R Deltoid today. Patient tolerated injection. Tdap 0.5 mL IM was administered L Deltoid today. Patient tolerated injection. Patient due for follow up labs/provider appt: No.  Patient next injection due: n/a  [place]: DOD  Creft, Feliberto Harts

## 2023-05-01 NOTE — Telephone Encounter (Signed)
Pt also says she needs proof that she has had MMR/V.  I let her know that per [facility] Denise Mejia- maiden name) she has had Measles and Rubella. She will likely need titers. Please advise.

## 2023-05-01 NOTE — Telephone Encounter (Signed)
She is due for it. Okay to give?

## 2023-05-01 NOTE — Telephone Encounter (Signed)
That's fine

## 2023-05-01 NOTE — Telephone Encounter (Signed)
Noted  

## 2023-05-02 NOTE — Telephone Encounter (Signed)
Tried calling the pt to schedule and she had no VM set up, we can try later.

## 2023-05-02 NOTE — Addendum Note (Signed)
Addended by: Hyman Hopes B on: 05/02/2023 08:02 AM   Modules accepted: Orders

## 2023-05-03 ENCOUNTER — Other Ambulatory Visit (INDEPENDENT_AMBULATORY_CARE_PROVIDER_SITE_OTHER): Payer: Commercial Managed Care - HMO

## 2023-05-03 DIAGNOSIS — Z0184 Encounter for antibody response examination: Secondary | ICD-10-CM

## 2023-05-03 NOTE — Addendum Note (Signed)
Addended by: Thelma Barge D on: 05/03/2023 01:18 PM   Modules accepted: Orders

## 2023-05-03 NOTE — Telephone Encounter (Signed)
Mychart message sent to patient.

## 2023-05-04 LAB — MEASLES/MUMPS/RUBELLA IMMUNITY
Mumps IgG: 221 [AU]/ml
Rubella: 12.3 {index}
Rubeola IgG: 120 [AU]/ml

## 2023-05-05 ENCOUNTER — Encounter: Payer: Self-pay | Admitting: Family Medicine

## 2023-05-10 LAB — VARICELLA-ZOSTER VIRUS AB(IMMUNITY SCREEN),ACIF,SERUM: VARICELLA ZOSTER VIRUS AB (IMMUNITY SCR),ACIF SERUM: >=1:4 {titer}

## 2023-05-11 ENCOUNTER — Other Ambulatory Visit: Payer: Self-pay | Admitting: Family Medicine

## 2023-05-15 ENCOUNTER — Encounter: Payer: Self-pay | Admitting: Family Medicine

## 2023-05-18 ENCOUNTER — Ambulatory Visit (INDEPENDENT_AMBULATORY_CARE_PROVIDER_SITE_OTHER): Payer: Commercial Managed Care - HMO | Admitting: Family Medicine

## 2023-05-18 ENCOUNTER — Encounter: Payer: Self-pay | Admitting: Family Medicine

## 2023-05-18 VITALS — BP 147/44 | HR 72 | Ht 73.0 in | Wt 233.0 lb

## 2023-05-18 DIAGNOSIS — R739 Hyperglycemia, unspecified: Secondary | ICD-10-CM | POA: Diagnosis not present

## 2023-05-18 DIAGNOSIS — M79674 Pain in right toe(s): Secondary | ICD-10-CM

## 2023-05-18 DIAGNOSIS — R200 Anesthesia of skin: Secondary | ICD-10-CM

## 2023-05-18 LAB — HEMOGLOBIN A1C: Hgb A1c MFr Bld: 6.2 % (ref 4.6–6.5)

## 2023-05-18 MED ORDER — DICLOFENAC SODIUM 75 MG PO TBEC
75.0000 mg | DELAYED_RELEASE_TABLET | Freq: Two times a day (BID) | ORAL | 0 refills | Status: DC
Start: 2023-05-18 — End: 2023-08-28

## 2023-05-18 NOTE — Progress Notes (Signed)
Acute Office Visit  Subjective:     Patient ID: Corayma Mccrossen, female    DOB: [birth year], 62 y.o.   MRN: [id number]  Chief Complaint  Patient presents with   Numbness    HPI Patient is in today for toe discomfort.   Discussed the use of AI scribe software for clinical note transcription with the patient, who gave verbal consent to proceed.  History of Present Illness   The patient, with a history of chronic joint pain managed with Neurontin and occasional ibuprofen, presents with a new onset of numbness/throbbing in the right big toe. The sensation which started approximately a week ago, is localized to the big toe. The patient denies any associated radiating pain, swelling, or rashes.   The patient has been adhering to their medication regimen, but recently increased the dose of Neurontin due to the new symptoms. They also report a recent increase in red meat consumption. The patient has a history of an ulcer from their teenage years and tries to limit the use of ibuprofen. They have been experiencing mood changes in the afternoon, which they suspect may be related to the Neurontin wearing off.  The patient also reports a history of seeing a foot doctor last year, but has not had any follow-up this year due to a change in insurance - she would like to see someone again. They have been managing their discomfort with soaking in the tub and have noticed a callus on the medial side of their right great toe. The patient rates the discomfort as a 7 out of 10, describing it as a throbbing sensation at times and a numb feeling at other times. They deny any injuries to the foot.             ROS All review of systems negative except what is listed in the HPI      Objective:    BP (!) 147/44   Pulse 72   Ht 6\' 1"  (1.854 m)   Wt 233 lb (105.7 kg)   SpO2 100%   BMI 30.74 kg/m    Physical Exam Vitals reviewed.  Constitutional:      Appearance: Normal appearance.   Cardiovascular:     Pulses: Normal pulses.  Musculoskeletal:        General: No swelling, tenderness, deformity or signs of injury. Normal range of motion.     Right lower leg: No edema.     Left lower leg: No edema.     Comments: Diabetic foot exam was performed.  No deformities or other abnormal visual findings.  Posterior tibialis and dorsalis pulse intact bilaterally.  Intact to touch and monofilament testing bilaterally.  Sensation: able to feel everywhere, but right great toe "felt a little different"   Skin:    General: Skin is warm and dry.     Capillary Refill: Capillary refill takes less than 2 seconds.     Findings: No erythema.  Neurological:     Mental Status: She is alert and oriented to person, place, and time.  Psychiatric:        Mood and Affect: Mood normal.        Behavior: Behavior normal.        Thought Content: Thought content normal.        Judgment: Judgment normal.     No results found for any visits on 05/18/23.      Assessment & Plan:   Problem List Items Addressed This Visit  Active Problems   Hyperglycemia   Relevant Orders   HgB A1c   Other Visit Diagnoses     Numbness of toes    -  Primary   Relevant Orders   Ambulatory referral to Podiatry   Great toe pain, right       Relevant Medications   diclofenac (VOLTAREN) 75 MG EC tablet   Other Relevant Orders   Ambulatory referral to Podiatry     Good neurovascular assessment today. No obvious findings. Will try short term antiinflammatory and recheck your A1c.  Continue your gabapentin  Referral to podiatry  Patient aware of signs/symptoms requiring further/urgent evaluation.   Meds ordered this encounter  Medications   diclofenac (VOLTAREN) 75 MG EC tablet    Sig: Take 1 tablet (75 mg total) by mouth 2 (two) times daily.    Dispense:  30 tablet    Refill:  0    Order Specific Question:   Supervising Provider    Answer:   Danise Edge A [[id number]]    Return in about 2  weeks (around 06/01/2023) for BP check with nurse.  Clayborne Dana, NP

## 2023-05-18 NOTE — Patient Instructions (Addendum)
Good neurovascular assessment today. No obvious findings. Will try short term antiinflammatory and recheck your A1c.  Continue your gabapentin  Referral to podiatry

## 2023-05-24 ENCOUNTER — Ambulatory Visit: Payer: Self-pay | Admitting: Podiatry

## 2023-06-02 ENCOUNTER — Other Ambulatory Visit: Payer: Self-pay | Admitting: Family Medicine

## 2023-06-07 ENCOUNTER — Other Ambulatory Visit: Payer: Self-pay | Admitting: Family Medicine

## 2023-06-18 ENCOUNTER — Telehealth: Payer: Self-pay | Admitting: Family Medicine

## 2023-06-18 DIAGNOSIS — F419 Anxiety disorder, unspecified: Secondary | ICD-10-CM

## 2023-06-18 NOTE — Addendum Note (Signed)
Addended by: Hyman Hopes B on: 06/18/2023 08:53 PM   Modules accepted: Orders

## 2023-06-18 NOTE — Addendum Note (Signed)
Addended bySilvio Pate on: 06/18/2023 03:57 PM   Modules accepted: Orders

## 2023-06-18 NOTE — Telephone Encounter (Signed)
Pt called stating that she would like to have a behavioral heath referral done to an office in [place]. Pt stated she has had a referral for this before but the original office did not accept her insurance.

## 2023-06-18 NOTE — Telephone Encounter (Signed)
Looks like past referral was placed for anxiety. Referral pended.

## 2023-06-19 NOTE — Telephone Encounter (Signed)
New referral placed.

## 2023-06-22 ENCOUNTER — Telehealth: Payer: Self-pay | Admitting: Family Medicine

## 2023-06-22 NOTE — Telephone Encounter (Signed)
Pt called and requested for a referral to cardiology to be sent to [facility]. Please advise.

## 2023-06-22 NOTE — Telephone Encounter (Signed)
Patient sent mychart message as well. Already responded.

## 2023-06-27 ENCOUNTER — Encounter: Payer: Self-pay | Admitting: Gastroenterology

## 2023-06-27 ENCOUNTER — Ambulatory Visit (INDEPENDENT_AMBULATORY_CARE_PROVIDER_SITE_OTHER): Payer: Commercial Managed Care - HMO | Admitting: Gastroenterology

## 2023-06-27 VITALS — BP 122/42 | HR 85 | Ht 73.0 in | Wt 229.0 lb

## 2023-06-27 DIAGNOSIS — R1013 Epigastric pain: Secondary | ICD-10-CM | POA: Diagnosis not present

## 2023-06-27 DIAGNOSIS — R12 Heartburn: Secondary | ICD-10-CM

## 2023-06-27 NOTE — Progress Notes (Signed)
[facility] Consult Note:  History: Denise Mejia 06/27/2023  Referring provider: Clayborne Dana, NP  Reason for consult/chief complaint: Abdominal Pain (Burning in epigastric, onset months, takes Nexium 40 mg every day (started months ago) and it helps a little, not associated with what she eats, she feels something coming up her esophagus and has to drink water to milk and that helps)   Subjective  HPI: Denise Mejia contacted our office months ago requesting a transfer of care from a [facility] health practice because they no longer take her insurance.  She saw them as recently as 2022 with a note including the following: "HPI: This is a 62 year old woman who is referred by Dr. Evette Doffing for reflux. She is followed by neurology seen by Dr. Evette Doffing on 03/17/2021 for headaches, neuritis. She does have OSA on CPAP. At that time she complained of having reflux symptoms and as such referred to GI. She been evaluated by [place] in the past last seen in October 2020 for abdominal pain. She has a history of hypertension, depression. She seen in 2019 for right lower quadrant abdominal pain with heme positive stool. This was thought to be functional in etiology at that time she had a CT abdomen pelvis without contrast on 04/24/2018 there was no be negative. Normal CBC, CMP other than chronically low hemoglobin. Colonoscopy on 11/22/2017 for screening with Dr. Joslyn Hy which revealed normal colon to the cecum, placed on 10 year recall. She is previously seen gynecology with unremarkable pelvic exam for right lower quadrant abdominal pain. She was given dicyclomine 20 mg 3 times a day. 10/18/2020 CMP normal, CBC with a hemoglobin of 11.2 that is been chronically mildly anemic. Ferritin of 169 with iron of 47, iron saturation 20 suggesting anemia of chronic disease. She is on meloxicam. She is not on PPI.  She reports that she has had chronic reflux for several years. Long time ago she was on  Nexium which controls her acid reflux that she is slowly weaned off of. The dietary modification as well as elevating the head of the bed, her reflux has been under somewhat better control however, for the past couple years she has had epigastric burning as well as regurgitation/reflux. This occurs during the daytime more so than nocturnally. No dysphagia. No hematemesis. No melena. 1 week ago she picked up famotidine 20 mg over-the-counter that she has been taking twice daily which has helped some although she continues to have some symptoms. She takes baby aspirin and very occasional NSAIDs. "  It sounds like upper endoscopy was planned, though I cannot see for certain from the available electronic records that it was done. ________________  Today, she complains of a burning sensation located in her epigastric region. She was seen in the ED few months ago, where she was prescribed Nexium 40 mg, she has been taking it for the past few months which seems to somewhat manage her symptoms but not completely resolve them. She can't seem to identify any foods that trigger her symptoms. She also reports occasionally experiencing accompanying food regurgitation and nausea but denies any accompanying vomiting or dysphagia. She gets intermittent relief consuming water and milk to help relief the regurgitation.    She reports taking Voltaren 75 mg for knee pain which she started about a month after starting Nexium 40 mg. She denies undergoing an previous H. Pylori testing that she recalls. We discussed undergoing further testing including an EGD to help identify any possible underlying causes.  No nausea or vomiting She denies any family history of esophagus or stomach cancer.   ROS:  Review of Systems  Constitutional:  Negative for appetite change and fever.  HENT:  Negative for trouble swallowing.   Respiratory:  Negative for cough and shortness of breath.   Cardiovascular:  Negative for chest pain.   Gastrointestinal:  Positive for abdominal pain and nausea. Negative for abdominal distention, anal bleeding, blood in stool, constipation, diarrhea, rectal pain and vomiting.       +reflux +heartburn =regurgitation   Genitourinary:  Negative for dysuria.  Musculoskeletal:  Negative for back pain.  Skin:  Negative for rash.  Neurological:  Negative for weakness.  All other systems reviewed and are negative.    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   Class 1 obesity due to excess calories with serious comorbidity and body mass index (BMI) of 30.0 to 30.9 in adult 09/19/2018   Depression    pt states she does not have history of depresssion 02-18-2014   Essential hypertension 05/14/2013   Excessive daytime sleepiness 03/22/2017   Facet arthropathy, lumbosacral 04/03/2022   Fibroid    GERD (gastroesophageal reflux disease)    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 Formatting of this note might be different from the original. Last Assessment & Plan: Improving with sertraline, continue at current dose with follow up in 3 months. Last Assessment & Plan: Formatting of this note might be different from the original. Improving with sertraline, continue at current dose with follow up in 3 months. Format   Murmur 05/14/2013   Obesity (BMI 30-39.9) 02/16/2022   Sacroiliac joint dysfunction of both sides 02/24/2022   Sleep  apnea    Vitamin D deficiency 08/21/2022     Past Surgical History: Past Surgical History:  Procedure Laterality Date   CARPAL TUNNEL RELEASE     bilateral   INDUCED ABORTION     X2   TOTAL ABDOMINAL HYSTERECTOMY       Family History: Family History  Problem Relation Age of Onset   Stroke Mother    Lung cancer Father    Hypertension Sister    Colon cancer Neg Hx    Esophageal cancer Neg Hx    Stomach cancer Neg Hx     Social History: Social History   Socioeconomic History   Marital status: Single    Spouse name: Not on file   Number of children: 1   Years of education: Not on file   Highest education level: Not on file  Occupational History    Comment: Nurses aide  Tobacco Use   Smoking status: Never   Smokeless tobacco: Never  Vaping Use   Vaping status: Never Used  Substance and Sexual Activity   Alcohol use: No   Drug use: No   Sexual activity: Not Currently    Birth control/protection: Surgical  Other Topics Concern  Not on file  Social History Narrative   Not on file   Social Determinants of Health   Financial Resource Strain: Low Risk  (07/01/2021)   Received from [facility], [facility]   Overall Financial Resource Strain (CARDIA)    Difficulty of Paying Living Expenses: Not hard at all  Food Insecurity: No Food Insecurity (07/01/2021)   Received from [facility], [facility]   Hunger Vital Sign    Worried About Running Out of Food in the Last Year: Never true    Ran Out of Food in the Last Year: Never true  Transportation Needs: No Transportation Needs (07/01/2021)   Received from [facility], [facility]   PRAPARE - Transportation    Lack of Transportation (Medical): No    Lack of Transportation (Non-Medical): No  Physical Activity: Not on file  Stress: No Stress Concern Present (07/01/2021)   Received from [facility], [facility] - Occupational Stress Questionnaire     Feeling of Stress : Not at all  Social Connections: Unknown (01/14/2022)   Received from [facility], [facility]   Social Network    Social Network: Not on file    Allergies: Allergies  Allergen Reactions   Lisinopril Other (See Comments) and Cough    cough     Outpatient Meds: Current Outpatient Medications  Medication Sig Dispense Refill   acyclovir (ZOVIRAX) 800 MG tablet Take 1 tablet (800 mg total) by mouth daily. (Patient taking differently: Take 800 mg by mouth as needed (out breaks).) 90 tablet 1   albuterol (VENTOLIN HFA) 108 (90 Base) MCG/ACT inhaler Inhale 2 puffs into the lungs every 6 (six) hours as needed for wheezing or shortness of breath (Cough). 54 g 1   amLODipine (NORVASC) 10 MG tablet Take 1 tablet (10 mg total) by mouth daily. 30 tablet 0   atorvastatin (LIPITOR) 20 MG tablet Take 1 tablet (20 mg total) by mouth daily. 90 tablet 1   buPROPion (WELLBUTRIN XL) 150 MG 24 hr tablet Take 1 tablet (150 mg total) by mouth daily. 90 tablet 0   diclofenac (VOLTAREN) 75 MG EC tablet Take 1 tablet (75 mg total) by mouth 2 (two) times daily. 30 tablet 0   esomeprazole (NEXIUM) 40 MG capsule Take 1 capsule (40 mg total) by mouth daily at 12 noon. 90 capsule 1   gabapentin (NEURONTIN) 300 MG capsule TAKE 1 CAPSULE BY MOUTH IN THE MORNING AND AT BEDTIME 180 capsule 0   losartan-hydrochlorothiazide (HYZAAR) 100-25 MG tablet Take 1 tablet by mouth once daily 90 tablet 0   cetirizine (ZYRTEC ALLERGY) 10 MG tablet Take 1 tablet (10 mg total) by mouth at bedtime. 90 tablet 1   No current facility-administered medications for this visit.      ___________________________________________________________________ Objective   Exam:  BP (!) 122/42   Pulse 85   Ht 6\' 1"  (1.854 m)   Wt 229 lb (103.9 kg)   SpO2 98%   BMI 30.21 kg/m  Wt Readings from Last 3 Encounters:  06/27/23 229 lb (103.9 kg)  05/18/23 233 lb (105.7 kg)  02/06/23 226 lb (102.5 kg)   General:  well-appearing   Eyes: sclera anicteric, no redness ENT: oral mucosa moist without lesions, no cervical or supraclavicular lymphadenopathy CV: RRR, no JVD, no peripheral edema Resp: clear to auscultation bilaterally, normal RR and effort noted GI: soft, no tenderness, with active bowel sounds. No guarding or palpable organomegaly noted. Skin; warm and dry,  no rash or jaundice noted Neuro: awake, alert and oriented x 3. Normal gross motor function and fluent speech   Labs:   Radiologic Studies:   Assessment: Epigastric pain  Heartburn  Sounds most like GERD, possible H. pylori as well.  Less likely achalasia or neoplasia.  Does not sound like biliary colic. Notes from prior GI provider suggested some functional abdominal pain at times.  Plan: -EGD and probable biopsies for H. Pylori No changes to medicines at this point   Thank you for the courtesy of this consult.  Please call me with any questions or concerns.   - Amada Jupiter, MD    [facility]   Ladona Mow M Kadhim,acting as a scribe for Charlie Pitter III, MD.,have documented all relevant documentation on the behalf of Sherrilyn Rist, MD,as directed by  Sherrilyn Rist, MD while in the presence of Sherrilyn Rist, MD.   Marvis Repress III, MD, have reviewed all documentation for this visit. The documentation on 06/27/23 for the exam, diagnosis, procedures, and orders are all accurate and complete.    CC: Referring provider noted above

## 2023-06-27 NOTE — Patient Instructions (Addendum)
You have been scheduled for an endoscopy. Please follow written instructions given to you at your visit today.  If you use inhalers (even only as needed), please bring them with you on the day of your procedure.  If you take any of the following medications, they will need to be adjusted prior to your procedure:   DO NOT TAKE 7 DAYS PRIOR TO TEST- Trulicity (dulaglutide) Ozempic, Wegovy (semaglutide) Mounjaro (tirzepatide) Bydureon Bcise (exanatide extended release)  DO NOT TAKE 1 DAY PRIOR TO YOUR TEST Rybelsus (semaglutide) Adlyxin (lixisenatide) Victoza (liraglutide) Byetta (exanatide) ___________________________________________________________________________     _______________________________________________________  If your blood pressure at your visit was 140/90 or greater, please contact your primary care physician to follow up on this.  _______________________________________________________  If you are age 60 or older, your body mass index should be between 23-30. Your Body mass index is 30.21 kg/m. If this is out of the aforementioned range listed, please consider follow up with your Primary Care Provider.  If you are age 4 or younger, your body mass index should be between 19-25. Your Body mass index is 30.21 kg/m. If this is out of the aformentioned range listed, please consider follow up with your Primary Care Provider.   ________________________________________________________  The [facility] providers would like to encourage you to use [facility] to communicate with providers for non-urgent requests or questions.  Due to long hold times on the telephone, sending your provider a message by [facility] may be a faster and more efficient way to get a response.  Please allow 48 business hours for a response.  Please remember that this is for non-urgent requests.  _______________________________________________________ It was a pleasure to see you today!  Thank you for  trusting me with your gastrointestinal care!

## 2023-06-27 NOTE — Progress Notes (Deleted)
[facility] Consult Note:  History: Kina Murrell 06/27/2023  Referring provider: Clayborne Dana, NP  Reason for consult/chief complaint: No chief complaint on file.   Subjective  HPI: Ritika contacted our office months ago requesting a transfer of care from a [facility] practice because they no longer take her insurance.  She saw them as recently as 2022 with a note including the following: "HPI: This is a 62 year old woman who is referred by Dr. Evette Doffing for reflux. She is followed by neurology seen by Dr. Evette Doffing on 03/17/2021 for headaches, neuritis. She does have OSA on CPAP. At that time she complained of having reflux symptoms and as such referred to GI. She been evaluated by [place] in the past last seen in October 2020 for abdominal pain. She has a history of hypertension, depression. She seen in 2019 for right lower quadrant abdominal pain with heme positive stool. This was thought to be functional in etiology at that time she had a CT abdomen pelvis without contrast on 04/24/2018 there was no be negative. Normal CBC, CMP other than chronically low hemoglobin. Colonoscopy on 11/22/2017 for screening with Dr. Joslyn Hy which revealed normal colon to the cecum, placed on 10 year recall. She is previously seen gynecology with unremarkable pelvic exam for right lower quadrant abdominal pain. She was given dicyclomine 20 mg 3 times a day. 10/18/2020 CMP normal, CBC with a hemoglobin of 11.2 that is been chronically mildly anemic. Ferritin of 169 with iron of 47, iron saturation 20 suggesting anemia of chronic disease. She is on meloxicam. She is not on PPI.  She reports that she has had chronic reflux for several years. Long time ago she was on Nexium which controls her acid reflux that she is slowly weaned off of. The dietary modification as well as elevating the head of the bed, her reflux has been under somewhat better control however, for the past couple years she  has had epigastric burning as well as regurgitation/reflux. This occurs during the daytime more so than nocturnally. No dysphagia. No hematemesis. No melena. 1 week ago she picked up famotidine 20 mg over-the-counter that she has been taking twice daily which has helped some although she continues to have some symptoms. She takes baby aspirin and very occasional NSAIDs. "  It sounds like upper endoscopy was planned, though I cannot see for certain from the available electronic records that it was done. 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   Class 1 obesity due to excess calories with serious comorbidity and body mass index (BMI) of 30.0 to 30.9 in adult 09/19/2018   Depression    pt states she does not have history of depresssion 02-18-2014   Essential hypertension 05/14/2013   Excessive daytime sleepiness 03/22/2017   Facet arthropathy, lumbosacral 04/03/2022   Fibroid    GERD (gastroesophageal reflux disease)    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 Formatting of this note might be different from the original. Last Assessment & Plan: Improving with sertraline, continue at current dose with follow up in 3 months. Last Assessment & Plan: Formatting of this note might be different from the original. Improving with sertraline, continue at current dose with follow up in 3  months. Format   Murmur 05/14/2013   Obesity (BMI 30-39.9) 02/16/2022   Sacroiliac joint dysfunction of both sides 02/24/2022   Sleep apnea    Vitamin D deficiency 08/21/2022     Past Surgical History: Past Surgical History:  Procedure Laterality Date   CARPAL TUNNEL RELEASE     bilateral   INDUCED ABORTION     X2   TOTAL ABDOMINAL HYSTERECTOMY       Family History: Family History  Problem Relation Age of Onset   Stroke Mother    Hypertension Sister     Social History: Social History   Socioeconomic History   Marital status: Single    Spouse name: Not on file   Number of children: 1   Years of education: Not on file   Highest education level: Not on file  Occupational History    Comment: Nurses aide  Tobacco Use   Smoking status: Never   Smokeless tobacco: Never  Vaping Use   Vaping status: Never Used  Substance and Sexual Activity   Alcohol use: No   Drug use: No   Sexual activity: Not Currently    Birth control/protection: Surgical  Other Topics Concern   Not on file  Social History Narrative   Not on file   Social Determinants of Health   Financial Resource Strain: Low Risk  (07/01/2021)   Received from [facility], [facility]   Overall Financial Resource Strain (CARDIA)    Difficulty of Paying Living Expenses: Not hard at all  Food Insecurity: No Food Insecurity (07/01/2021)   Received from [facility]   Hunger Vital Sign    Worried About Running Out of Food in the Last Year: Never true    Ran Out of Food in the Last Year: Never true  Transportation Needs: No Transportation Needs (07/01/2021)   Received from [facility], [facility]   PRAPARE - Transportation    Lack of Transportation (Medical): No    Lack of Transportation (Non-Medical): No  Physical Activity: Not on file  Stress: No Stress Concern Present (07/01/2021)   Received from [facility], [facility] -  Occupational Stress Questionnaire    Feeling of Stress : Not at all  Social Connections: Unknown (01/14/2022)   Received from [facility], [facility]   Social Network    Social Network: Not on file    Allergies: Allergies  Allergen Reactions   Lisinopril Other (See Comments) and Cough    cough     Outpatient Meds: Current Outpatient Medications  Medication Sig Dispense Refill   acyclovir (ZOVIRAX) 800 MG tablet Take 1 tablet (800 mg total) by mouth daily. (Patient taking differently: Take 800 mg by mouth as needed (out breaks).) 90 tablet 1   albuterol (VENTOLIN HFA) 108 (90 Base) MCG/ACT inhaler Inhale 2 puffs into the lungs every 6 (six) hours as needed for wheezing or shortness of breath (Cough). 54 g 1   amLODipine (NORVASC) 10 MG tablet Take 1 tablet (10 mg total) by mouth daily. 30 tablet 0  atorvastatin (LIPITOR) 20 MG tablet Take 1 tablet (20 mg total) by mouth daily. 90 tablet 1   buPROPion (WELLBUTRIN XL) 150 MG 24 hr tablet Take 1 tablet (150 mg total) by mouth daily. 90 tablet 0   cetirizine (ZYRTEC ALLERGY) 10 MG tablet Take 1 tablet (10 mg total) by mouth at bedtime. 90 tablet 1   diclofenac (VOLTAREN) 75 MG EC tablet Take 1 tablet (75 mg total) by mouth 2 (two) times daily. 30 tablet 0   esomeprazole (NEXIUM) 40 MG capsule Take 1 capsule (40 mg total) by mouth daily at 12 noon. 90 capsule 1   gabapentin (NEURONTIN) 300 MG capsule TAKE 1 CAPSULE BY MOUTH IN THE MORNING AND AT BEDTIME 180 capsule 0   losartan-hydrochlorothiazide (HYZAAR) 100-25 MG tablet Take 1 tablet by mouth once daily 90 tablet 0   No current facility-administered medications for this visit.      ___________________________________________________________________ Objective   Exam:  There were no vitals taken for this visit. Wt Readings from Last 3 Encounters:  05/18/23 233 lb (105.7 kg)  02/06/23 226 lb (102.5 kg)  01/08/23 227 lb (103 kg)    General: ***  Eyes: sclera anicteric,  no redness ENT: oral mucosa moist without lesions, no cervical or supraclavicular lymphadenopathy CV: ***, no JVD, no peripheral edema Resp: clear to auscultation bilaterally, normal RR and effort noted GI: soft, *** tenderness, with active bowel sounds. No guarding or palpable organomegaly noted. Skin; warm and dry, no rash or jaundice noted Neuro: awake, alert and oriented x 3. Normal gross motor function and fluent speech  Labs:  ***  Radiologic Studies:  ***  Assessment: No diagnosis found.  ***  Plan:  ***  Thank you for the courtesy of this consult.  Please call me with any questions or concerns.  Charlie Pitter III  CC: Referring provider noted above

## 2023-06-28 ENCOUNTER — Telehealth: Payer: Self-pay | Admitting: Family Medicine

## 2023-06-28 DIAGNOSIS — F419 Anxiety disorder, unspecified: Secondary | ICD-10-CM

## 2023-06-28 NOTE — Telephone Encounter (Signed)
Referral last sent last December. Okay to send? Pended.

## 2023-06-28 NOTE — Addendum Note (Signed)
Addended by: Silvio Pate on: 06/28/2023 03:43 PM   Modules accepted: Orders

## 2023-06-28 NOTE — Telephone Encounter (Signed)
Pt called and requested to speak with nurse. I asked the patient what it was regarding to direct her appropriately and patient refused to discuss further with me and wanted to speak with management. Per the patient and Angela's conversation, the patient needs her behavioral health referral sent to [facility] on [address] in [place]. Patient does not want to go to [place], where it was originally sent. Please call and advise.

## 2023-06-29 NOTE — Addendum Note (Signed)
Addended by: Silvio Pate on: 06/29/2023 08:13 AM   Modules accepted: Orders

## 2023-06-29 NOTE — Telephone Encounter (Signed)
Tried to call patient to make her aware referral placed, no answer, vm not set up.   Mychart message sent to patient.

## 2023-06-29 NOTE — Telephone Encounter (Signed)
Referral placed.

## 2023-07-01 ENCOUNTER — Encounter: Payer: Self-pay | Admitting: Certified Registered Nurse Anesthetist

## 2023-07-05 ENCOUNTER — Encounter: Payer: Commercial Managed Care - HMO | Admitting: Gastroenterology

## 2023-07-06 ENCOUNTER — Other Ambulatory Visit: Payer: Self-pay | Admitting: Family Medicine

## 2023-07-08 NOTE — Progress Notes (Unsigned)
Cardiology Office Note:    Date:  07/11/2023   ID:  Denise Mejia, DOB 1961-07-22, MRN [id number]  PCP:  Clayborne Dana, NP   [facility] Providers Cardiologist:  Norman Herrlich, MD Cardiology APP:  Marcelino Duster, PA     Referring MD: Clayborne Dana, NP   Chief Complaint  Patient presents with   Follow-up    palpitations    History of Present Illness:    Denise Mejia is a 62 y.o. female with a hx of HTN, HLD, atypical chest pain. She was evaluated during a hospitalization 2021 for chest pain with nuclear stress test that was nonischemic, no prior infarction. She was seen by Dr. Dulce Sellar 04/2022 for hypertension. She also reported SOB, LE edema and had murmur on exam. Echocardiogram was recommended, but I do not see these results.   She presents today for follow up. She reports heart palpitations that occurred twice last week that lasted 15-20 min. She does report high caffeine intake. She does not want to take a BB. She is also sleeping more after eating x 2-3 weeks. She is not using her CPAP.   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   Class 1 obesity due to excess calories with serious comorbidity and body mass index (BMI) of 30.0 to 30.9 in adult 09/19/2018   Depression    pt states she does not have history of depresssion 02-18-2014   Essential hypertension 05/14/2013   Excessive daytime sleepiness 03/22/2017   Facet arthropathy, 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 Formatting of this note might be different from the original. Last Assessment & Plan: Improving with sertraline, continue at current dose with follow up in 3 months. Last Assessment & Plan: Formatting of this note might be different from the original. Improving with sertraline, continue at current dose with follow up in 3 months. Format   Murmur 05/14/2013   Obesity (BMI 30-39.9) 02/16/2022   Sacroiliac joint dysfunction of both sides 02/24/2022   Sleep apnea    Vitamin D deficiency 08/21/2022    Past Surgical History:  Procedure Laterality Date   CARPAL TUNNEL RELEASE     bilateral   INDUCED ABORTION     X2   TOTAL ABDOMINAL HYSTERECTOMY      Current Medications: Current Meds  Medication Sig   acyclovir (ZOVIRAX) 800 MG tablet Take 1 tablet (800 mg total) by mouth daily. (Patient taking differently: Take 800 mg by mouth as needed (out breaks).)   albuterol (VENTOLIN HFA) 108 (90 Base) MCG/ACT inhaler Inhale 2 puffs into the lungs every 6 (six) hours as needed for wheezing or shortness of breath (Cough).   amLODipine (NORVASC) 10 MG tablet Take 1  tablet by mouth once daily   atorvastatin (LIPITOR) 20 MG tablet Take 1 tablet (20 mg total) by mouth daily.   buPROPion (WELLBUTRIN XL) 150 MG 24 hr tablet Take 1 tablet (150 mg total) by mouth daily.   esomeprazole (NEXIUM) 40 MG capsule Take 1 capsule (40 mg total) by mouth daily at 12 noon.   gabapentin (NEURONTIN) 300 MG capsule TAKE 1 CAPSULE BY MOUTH IN THE MORNING AND AT BEDTIME   losartan-hydrochlorothiazide (HYZAAR) 100-25 MG tablet Take 1 tablet by mouth once daily     Allergies:   Lisinopril   Social History   Socioeconomic History   Marital status: Single    Spouse name: Not on file   Number of children: 1    Years of education: Not on file   Highest education level: Not on file  Occupational History    Comment: Nurses aide  Tobacco Use   Smoking status: Never   Smokeless tobacco: Never  Vaping Use   Vaping status: Never Used  Substance and Sexual Activity   Alcohol use: No   Drug use: No   Sexual activity: Not Currently    Birth control/protection: Surgical  Other Topics Concern   Not on file  Social History Narrative   Not on file   Social Determinants of Health   Financial Resource Strain: Low Risk  (07/01/2021)   Received from [facility], [facility]   Overall Financial Resource Strain (CARDIA)    Difficulty of Paying Living Expenses: Not hard at all  Food Insecurity: No Food Insecurity (07/01/2021)   Received from [facility], [facility]   Hunger Vital Sign    Worried About Running Out of Food in the Last Year: Never true    Ran Out of Food in the Last Year: Never true  Transportation Needs: No Transportation Needs (07/01/2021)   Received from [facility], [facility]   PRAPARE - Transportation    Lack of Transportation (Medical): No    Lack of Transportation (Non-Medical): No  Physical Activity: Not on file  Stress: No Stress Concern Present (07/01/2021)   Received from [facility], [facility] - Occupational Stress Questionnaire    Feeling of Stress : Not at all  Social Connections: Unknown (01/14/2022)   Received from [facility], [facility]   Social Network    Social Network: Not on file     Family History: The patient's family history includes Hypertension in her sister; Lung cancer in her father; Stroke in her mother. There is no history of Colon cancer, Esophageal cancer, or Stomach cancer.  ROS:   Please see the history of present illness.     All other systems reviewed and are negative.  EKGs/Labs/Other Studies Reviewed:    The following studies were reviewed today:  EKG  Interpretation Date/Time:  Wednesday July 11 2023 10:26:47 EST Ventricular Rate:  73 PR Interval:  158 QRS Duration:  72 QT Interval:  358 QTC Calculation: 394 R Axis:   29  Text Interpretation: Normal sinus rhythm ST & T wave abnormality, consider inferolateral ischemia When compared with ECG of 03-Apr-2020 11:21, PREVIOUS ECG IS PRESENT Confirmed by Micah Flesher ([id number]) on 07/11/2023 11:05:08 AM    Recent Labs: 08/21/2022: ALT 12; BUN 11; Creatinine, Ser 0.93; Hemoglobin 11.3; Platelets 226.0; Potassium 3.9; Sodium 141  Recent Lipid Panel    Component Value Date/Time   CHOL 142 08/21/2022 0909   TRIG 71.0 08/21/2022 0909   HDL 49.30  08/21/2022 0909   CHOLHDL 3 08/21/2022 0909   VLDL 14.2 08/21/2022 0909   LDLCALC 78 08/21/2022 0909     Risk Assessment/Calculations:                Physical Exam:    VS:  BP 124/88   Pulse 73   Ht 6\' 1"  (1.854 m)   Wt 231 lb 9.6 oz (105.1 kg)   SpO2 97%   BMI 30.56 kg/m     Wt Readings from Last 3 Encounters:  07/11/23 231 lb 9.6 oz (105.1 kg)  06/27/23 229 lb (103.9 kg)  05/18/23 233 lb (105.7 kg)     GEN:  Well nourished, well developed in no acute distress HEENT: Normal NECK: No JVD; No carotid bruits LYMPHATICS: No lymphadenopathy CARDIAC: RRR, no murmurs, rubs, gallops RESPIRATORY:  Clear to auscultation without rales, wheezing or rhonchi  ABDOMEN: Soft, non-tender, non-distended MUSCULOSKELETAL:  No edema; No deformity  SKIN: Warm and dry NEUROLOGIC:  Alert and oriented x 3 PSYCHIATRIC:  Normal affect   ASSESSMENT:    1. Murmur   2. Essential hypertension   3. Medication management   4. Hyperlipidemia with target LDL less than 70    PLAN:    In order of problems listed above:  Heart palpitations - occurred twice last week - no syncope - no recurrence - she does not want to take a BB - not using CPAP - increased fatigue - will check BMP, Mg, and TSH - restart CPAP - hold off on monitor for now  unless increase in occurrence - she also just ran out of OTC Mg- restart this   Hypertension - 10 mg amlodipine, losartan-hydrochlorothiazide   Hyperlipidemia 08/21/2022: Cholesterol 142; HDL 49.30; LDL Cholesterol 78; Triglycerides 71.0; VLDL 14.2 Continue 20 mg lipitor   Return in 1 months. If still having palpitations, check heart monitor and possibly echo.  Assign to a new cardiologist at next visit.           Medication Adjustments/Labs and Tests Ordered: Current medicines are reviewed at length with the patient today.  Concerns regarding medicines are outlined above.  Orders Placed This Encounter  Procedures   Basic metabolic panel   Magnesium   TSH   EKG 12-Lead   No orders of the defined types were placed in this encounter.   Patient Instructions  Medication Instructions:  Restart OTC Magnesium 400 mg daily  *If you need a refill on your cardiac medications before your next appointment, please call your pharmacy*   Lab Work: BMET, Magnesium, TSH today.   Testing/Procedures: NONE ordered at this time of appointment     Follow-Up: At [facility], you and your health needs are our priority.  As part of our continuing mission to provide you with exceptional heart care, we have created designated Provider Care Teams.  These Care Teams include your primary Cardiologist (physician) and Advanced Practice Providers (APPs -  Physician Assistants and Nurse Practitioners) who all work together to provide you with the care you need, when you need it.  We recommend signing up for the patient portal called "MyChart".  Sign up information is provided on this After Visit Summary.  MyChart is used to connect with patients for Virtual Visits (Telemedicine).  Patients are able to view lab/test results, encounter notes, upcoming appointments, etc.  Non-urgent messages can be sent to your provider as well.   To learn more about what you can do with MyChart, go to  [email or web address].  Your next appointment:   1 month(s)  Provider:   Micah Flesher, PA-C        Other Instructions Start using CPAP machine.   Signed, Marcelino Duster, PA  07/11/2023 11:24 AM    [facility]

## 2023-07-11 ENCOUNTER — Encounter: Payer: Self-pay | Admitting: Physician Assistant

## 2023-07-11 ENCOUNTER — Ambulatory Visit: Payer: Managed Care, Other (non HMO) | Attending: Physician Assistant | Admitting: Physician Assistant

## 2023-07-11 VITALS — BP 124/88 | HR 73 | Ht 73.0 in | Wt 231.6 lb

## 2023-07-11 DIAGNOSIS — E785 Hyperlipidemia, unspecified: Secondary | ICD-10-CM | POA: Diagnosis not present

## 2023-07-11 DIAGNOSIS — Z79899 Other long term (current) drug therapy: Secondary | ICD-10-CM

## 2023-07-11 DIAGNOSIS — I10 Essential (primary) hypertension: Secondary | ICD-10-CM

## 2023-07-11 DIAGNOSIS — R011 Cardiac murmur, unspecified: Secondary | ICD-10-CM | POA: Diagnosis not present

## 2023-07-11 NOTE — Patient Instructions (Addendum)
Medication Instructions:  Restart OTC Magnesium 400 mg daily  *If you need a refill on your cardiac medications before your next appointment, please call your pharmacy*   Lab Work: BMET, Magnesium, TSH today.   Testing/Procedures: NONE ordered at this time of appointment     Follow-Up: At [facility], you and your health needs are our priority.  As part of our continuing mission to provide you with exceptional heart care, we have created designated Provider Care Teams.  These Care Teams include your primary Cardiologist (physician) and Advanced Practice Providers (APPs -  Physician Assistants and Nurse Practitioners) who all work together to provide you with the care you need, when you need it.  We recommend signing up for the patient portal called "MyChart".  Sign up information is provided on this After Visit Summary.  MyChart is used to connect with patients for Virtual Visits (Telemedicine).  Patients are able to view lab/test results, encounter notes, upcoming appointments, etc.  Non-urgent messages can be sent to your provider as well.   To learn more about what you can do with MyChart, go to [email or web address].    Your next appointment:   1 month(s)  Provider:   Micah Flesher, PA-C        Other Instructions Start using CPAP machine.

## 2023-07-12 LAB — BASIC METABOLIC PANEL
BUN/Creatinine Ratio: 14 (ref 12–28)
BUN: 12 mg/dL (ref 8–27)
CO2: 30 mmol/L — ABNORMAL HIGH (ref 20–29)
Calcium: 9.5 mg/dL (ref 8.7–10.3)
Chloride: 101 mmol/L (ref 96–106)
Creatinine, Ser: 0.86 mg/dL (ref 0.57–1.00)
Glucose: 112 mg/dL — ABNORMAL HIGH (ref 70–99)
Potassium: 4 mmol/L (ref 3.5–5.2)
Sodium: 139 mmol/L (ref 134–144)
eGFR: 77 mL/min/{1.73_m2} (ref >59)

## 2023-07-12 LAB — MAGNESIUM: Magnesium: 2.1 mg/dL (ref 1.6–2.3)

## 2023-07-12 LAB — TSH: TSH: 0.8 u[IU]/mL (ref 0.450–4.500)

## 2023-07-16 ENCOUNTER — Encounter: Payer: Self-pay | Admitting: Gastroenterology

## 2023-07-16 ENCOUNTER — Ambulatory Visit: Payer: Managed Care, Other (non HMO) | Admitting: Gastroenterology

## 2023-07-16 VITALS — BP 135/71 | HR 68 | Temp 97.3°F | Resp 18 | Ht 73.0 in | Wt 229.0 lb

## 2023-07-16 DIAGNOSIS — R1013 Epigastric pain: Secondary | ICD-10-CM | POA: Diagnosis not present

## 2023-07-16 DIAGNOSIS — K2950 Unspecified chronic gastritis without bleeding: Secondary | ICD-10-CM | POA: Diagnosis present

## 2023-07-16 MED ORDER — SODIUM CHLORIDE 0.9 % IV SOLN
500.0000 mL | Freq: Once | INTRAVENOUS | Status: DC
Start: 2023-07-16 — End: 2023-07-16

## 2023-07-16 NOTE — Progress Notes (Signed)
Called to room to assist during endoscopic procedure.  Patient ID and intended procedure confirmed with present staff. Received instructions for my participation in the procedure from the performing physician.  

## 2023-07-16 NOTE — Progress Notes (Signed)
Pt's states no medical or surgical changes since previsit or office visit. 

## 2023-07-16 NOTE — Op Note (Signed)
[facility] Patient Name: Denise Mejia Procedure Date: 07/16/2023 9:26 AM MRN: [id number] Endoscopist: Sherilyn Cooter L. Myrtie Neither , MD, [id number] Age: 62 Referring MD:  Date of Birth: 1960-09-10 Gender: Female Account #: [id number] Procedure:                Upper GI endoscopy Indications:              Epigastric abdominal pain, Suspected                            gastro-esophageal reflux disease                           clinical details in recent office consult note Medicines:                Monitored Anesthesia Care Procedure:                Pre-Anesthesia Assessment:                           - Prior to the procedure, a History and Physical                            was performed, and patient medications and                            allergies were reviewed. The patient's tolerance of                            previous anesthesia was also reviewed. The risks                            and benefits of the procedure and the sedation                            options and risks were discussed with the patient.                            All questions were answered, and informed consent                            was obtained. Prior Anticoagulants: The patient has                            taken no anticoagulant or antiplatelet agents. ASA                            Grade Assessment: II - A patient with mild systemic                            disease. After reviewing the risks and benefits,                            the patient was deemed in satisfactory condition to  undergo the procedure.                           After obtaining informed consent, the endoscope was                            passed under direct vision. Throughout the                            procedure, the patient's blood pressure, pulse, and                            oxygen saturations were monitored continuously. The                            GIF [id number] #[id number] was introduced  through the                            mouth, and advanced to the second part of duodenum.                            The upper GI endoscopy was accomplished without                            difficulty. The patient tolerated the procedure                            well. Scope In: Scope Out: Findings:                 The esophagus was normal.                           There is no endoscopic evidence of Barrett's                            esophagus, esophagitis, hiatal hernia, stricture,                            areas of dilation or lower esophageal sphincter                            tone abnormalities in the entire esophagus.                           Normal mucosa was found in the entire examined                            stomach. Several biopsies were obtained in the                            gastric body and in the gastric antrum with cold                            forceps for histology.  The cardia and gastric fundus were normal on                            retroflexion.                           The examined duodenum was normal. Complications:            No immediate complications. Estimated Blood Loss:     Estimated blood loss was minimal. Impression:               - Normal esophagus.                           - Normal mucosa was found in the entire stomach.                           - Normal examined duodenum.                           - Several biopsies were obtained in the gastric                            body and in the gastric antrum. Recommendation:           - Patient has a contact number available for                            emergencies. The signs and symptoms of potential                            delayed complications were discussed with the                            patient. Return to normal activities tomorrow.                            Written discharge instructions were provided to the                            patient.                            - Resume previous diet.                           - Continue present medications.                           - Await pathology results.                           - Return to my office at appointment to be                            scheduled. Chantay Whitelock L. Myrtie Neither, MD 07/16/2023 10:56:50 AM This report has been signed electronically.

## 2023-07-16 NOTE — Patient Instructions (Addendum)
-   Resume previous diet. - Continue present medications. - Await pathology results. - Return to my office at appointment to be scheduled.  YOU HAD AN ENDOSCOPIC PROCEDURE TODAY AT THE [facility]:   Refer to the procedure report that was given to you for any specific questions about what was found during the examination.  If the procedure report does not answer your questions, please call your gastroenterologist to clarify.  If you requested that your care partner not be given the details of your procedure findings, then the procedure report has been included in a sealed envelope for you to review at your convenience later.  YOU SHOULD EXPECT: Some feelings of bloating in the abdomen. Passage of more gas than usual.  Walking can help get rid of the air that was put into your GI tract during the procedure and reduce the bloating. If you had a lower endoscopy (such as a colonoscopy or flexible sigmoidoscopy) you may notice spotting of blood in your stool or on the toilet paper. If you underwent a bowel prep for your procedure, you may not have a normal bowel movement for a few days.  Please Note:  You might notice some irritation and congestion in your nose or some drainage.  This is from the oxygen used during your procedure.  There is no need for concern and it should clear up in a day or so.  SYMPTOMS TO REPORT IMMEDIATELY:  Following upper endoscopy (EGD)  Vomiting of blood or coffee ground material  New chest pain or pain under the shoulder blades  Painful or persistently difficult swallowing  New shortness of breath  Fever of 100F or higher  Black, tarry-looking stools  For urgent or emergent issues, a gastroenterologist can be reached at any hour by calling [phone]. Do not use MyChart messaging for urgent concerns.    DIET:  We do recommend a small meal at first, but then you may proceed to your regular diet.  Drink plenty of fluids but you should avoid alcoholic  beverages for 24 hours.  ACTIVITY:  You should plan to take it easy for the rest of today and you should NOT DRIVE or use heavy machinery until tomorrow (because of the sedation medicines used during the test).    FOLLOW UP: Our staff will call the number listed on your records the next business day following your procedure.  We will call around 7:15- 8:00 am to check on you and address any questions or concerns that you may have regarding the information given to you following your procedure. If we do not reach you, we will leave a message.     If any biopsies were taken you will be contacted by phone or by letter within the next 1-3 weeks.  Please call us at [phone] if you have not heard about the biopsies in 3 weeks.    SIGNATURES/CONFIDENTIALITY: You and/or your care partner have signed paperwork which will be entered into your electronic medical record.  These signatures attest to the fact that that the information above on your After Visit Summary has been reviewed and is understood.  Full responsibility of the confidentiality of this discharge information lies with you and/or your care-partner.

## 2023-07-16 NOTE — Progress Notes (Signed)
No significant changes to clinical history since GI office visit on 06/27/23.  The patient is appropriate for an endoscopic procedure in the ambulatory setting.  - Amada Jupiter, MD

## 2023-07-16 NOTE — Progress Notes (Signed)
Report to PACU, RN, vss, BBS= Clear.  

## 2023-07-17 ENCOUNTER — Telehealth: Payer: Self-pay

## 2023-07-17 NOTE — Telephone Encounter (Signed)
  Follow up Call-     07/16/2023   10:17 AM  Call back number  Post procedure Call Back phone  # [phone]  Permission to leave phone message Yes     Patient questions:  Do you have a fever, pain , or abdominal swelling? No. Pain Score  0 *  Have you tolerated food without any problems? Yes.    Have you been able to return to your normal activities? Yes.    Do you have any questions about your discharge instructions: Diet   No. Medications  No. Follow up visit  No.  Do you have questions or concerns about your Care? Yes.   Patient reports gas in "chest area" denies any pain. Stated it just started this morning, 07/17/2023. Informed to call back if issue continues and does not resolve. Pt verbalizes understanding.  Actions: * If pain score is 4 or above: No action needed, pain <4.

## 2023-07-18 ENCOUNTER — Encounter: Payer: Self-pay | Admitting: Gastroenterology

## 2023-07-18 LAB — SURGICAL PATHOLOGY

## 2023-07-27 ENCOUNTER — Telehealth: Payer: Self-pay | Admitting: Family Medicine

## 2023-07-27 NOTE — Telephone Encounter (Signed)
Patient would like a call back she has questions regarding her BP and her BP meds. Please call

## 2023-07-27 NOTE — Telephone Encounter (Signed)
Patient called. She checked her blood pressure and it was 110/73. She asked if she should hold her blood pressure medication if she is getting readings this low. I advised patient this is a good reading. She should call us if blood pressure is below 90/60, otherwise she should continue medications are prescribed. She expressed understanding.   Denise Mejia - FYI.

## 2023-07-30 NOTE — Telephone Encounter (Signed)
Thank you :)

## 2023-08-08 ENCOUNTER — Other Ambulatory Visit: Payer: Self-pay | Admitting: Family Medicine

## 2023-08-08 DIAGNOSIS — Z1231 Encounter for screening mammogram for malignant neoplasm of breast: Secondary | ICD-10-CM

## 2023-08-13 ENCOUNTER — Other Ambulatory Visit: Payer: Self-pay | Admitting: Family Medicine

## 2023-08-18 ENCOUNTER — Other Ambulatory Visit: Payer: Self-pay | Admitting: Family Medicine

## 2023-08-20 ENCOUNTER — Ambulatory Visit: Payer: Managed Care, Other (non HMO) | Admitting: Physician Assistant

## 2023-08-28 ENCOUNTER — Ambulatory Visit: Payer: Commercial Managed Care - HMO | Admitting: Family Medicine

## 2023-08-28 ENCOUNTER — Encounter: Payer: Self-pay | Admitting: Family Medicine

## 2023-08-28 VITALS — BP 120/54 | HR 78 | Ht 73.0 in | Wt 239.0 lb

## 2023-08-28 DIAGNOSIS — M5442 Lumbago with sciatica, left side: Secondary | ICD-10-CM

## 2023-08-28 DIAGNOSIS — G8929 Other chronic pain: Secondary | ICD-10-CM

## 2023-08-28 DIAGNOSIS — M79674 Pain in right toe(s): Secondary | ICD-10-CM | POA: Diagnosis not present

## 2023-08-28 DIAGNOSIS — M5441 Lumbago with sciatica, right side: Secondary | ICD-10-CM

## 2023-08-28 DIAGNOSIS — R739 Hyperglycemia, unspecified: Secondary | ICD-10-CM

## 2023-08-28 DIAGNOSIS — R200 Anesthesia of skin: Secondary | ICD-10-CM | POA: Diagnosis not present

## 2023-08-28 LAB — HEMOGLOBIN A1C: Hgb A1c MFr Bld: 6.4 % (ref 4.6–6.5)

## 2023-08-28 MED ORDER — GABAPENTIN 300 MG PO CAPS
300.0000 mg | ORAL_CAPSULE | Freq: Three times a day (TID) | ORAL | 3 refills | Status: AC
Start: 2023-08-28 — End: ?

## 2023-08-28 MED ORDER — DICLOFENAC SODIUM 75 MG PO TBEC: 75.0000 mg | DELAYED_RELEASE_TABLET | Freq: Two times a day (BID) | ORAL | 0 refills | Status: DC

## 2023-08-28 NOTE — Progress Notes (Signed)
Acute Office Visit  Subjective:     Patient ID: Denise Mejia, female    DOB: 07/13/1961, 62 y.o.   MRN: [id number]  Chief Complaint  Patient presents with   Pain    HPI Patient is in today for pain.   Discussed the use of AI scribe software for clinical note transcription with the patient, who gave verbal consent to proceed.  History of Present Illness   The patient presents with persistent discomfort in the left upper leg, described as a tender sensation that is particularly bothersome at night, disrupting sleep. The discomfort is localized to the outer aspect of the leg, extending from the knee to the hip. The patient has attempted to manage the discomfort by wrapping the area and elevating the leg with pillows at night, which provides some relief. History of sciatica in this leg.  In addition to the leg discomfort, the patient reports ongoing numbness in the right big toe, which has been a persistent issue. The numbness is described as irritating and is particularly noticeable when putting on socks. The patient denies any associated pain, swelling, redness, or heat in the toe.  The patient also reports intermittent lower back pain, which was previously diagnosed as DDD with sciatica. The back pain was particularly bothersome the day before the consultation.  Furthermore, the patient has been experiencing issues with the right knee, which has been swelling up and making a popping sound when walking. She has an upcoming orthopedic appointment later this week.  The patient has been on gabapentin for an extended period, which may no longer be providing relief. The patient has been taking BID.  The patient's current medications include gabapentin and diclofenac which the patient has not taken in a while and requested a refill for.   The patient's symptoms have been causing significant discomfort and disruption to daily activities, including work. Despite these challenges, the patient  remains hopeful and is actively seeking solutions to manage the symptoms.             Lab Results  Component Value Date   HGBA1C 6.4 08/28/2023      ROS All review of systems negative except what is listed in the HPI      Objective:    BP (!) 120/54   Pulse 78   Ht 6\' 1"  (1.854 m)   Wt 239 lb (108.4 kg)   SpO2 100%   BMI 31.53 kg/m    Physical Exam Vitals reviewed.  Constitutional:      Appearance: Normal appearance.  Skin:    General: Skin is warm and dry.     Findings: No bruising, erythema, lesion or rash.     Comments: Normal back and leg ROM; right great toe with normal color, warmth, cap refill, no edema, erythema, warmth, pain. Sensation felt, but "numb feeling" per patient   Neurological:     Mental Status: She is alert and oriented to person, place, and time.  Psychiatric:        Mood and Affect: Mood normal.        Behavior: Behavior normal.        Thought Content: Thought content normal.        Judgment: Judgment normal.         Results for orders placed or performed in visit on 08/28/23  Hemoglobin A1c  Result Value Ref Range   Hgb A1c MFr Bld 6.4 4.6 - 6.5 %        Assessment &  Plan:   Problem List Items Addressed This Visit       Active Problems   Chronic midline low back pain with bilateral sciatica   Relevant Medications   diclofenac (VOLTAREN) 75 MG EC tablet   gabapentin (NEURONTIN) 300 MG capsule   Hyperglycemia   Relevant Orders   Hemoglobin A1c (Completed)   Other Visit Diagnoses       Numbness of toes    -  Primary   Relevant Medications   gabapentin (NEURONTIN) 300 MG capsule   Other Relevant Orders   Ambulatory referral to Neurology     Great toe pain, right         Left Upper Leg Pain/Low Back Pain Intermittent pain for 3-4 weeks, worse at night, relieved by elevation. Possible radiculopathy from known lumbar degenerative disc disease. -Advise to discuss with orthopedic specialist at upcoming  appointment. -Consider physical therapy if orthopedic specialist agrees. -Restart home exercises and diclofenac, heat, stretching, massage.  Right Big Toe Numbness Chronic issue, possibly nerve-related. No redness, swelling, or pain. -Increase Gabapentin to three times a day. -Referral for nerve conduction study.   Meds ordered this encounter  Medications   diclofenac (VOLTAREN) 75 MG EC tablet    Sig: Take 1 tablet (75 mg total) by mouth 2 (two) times daily.    Dispense:  30 tablet    Refill:  0    Supervising Provider:   Danise Edge A [[id number]]   gabapentin (NEURONTIN) 300 MG capsule    Sig: Take 1 capsule (300 mg total) by mouth 3 (three) times daily.    Dispense:  180 capsule    Refill:  3    Supervising Provider:   Danise Edge A [[id number]]    Return if symptoms worsen or fail to improve.  Clayborne Dana, NP

## 2023-08-30 ENCOUNTER — Other Ambulatory Visit: Payer: Self-pay

## 2023-08-30 ENCOUNTER — Encounter: Payer: Self-pay | Admitting: Neurology

## 2023-08-30 DIAGNOSIS — R202 Paresthesia of skin: Secondary | ICD-10-CM

## 2023-08-31 ENCOUNTER — Ambulatory Visit (HOSPITAL_BASED_OUTPATIENT_CLINIC_OR_DEPARTMENT_OTHER)
Admission: RE | Admit: 2023-08-31 | Discharge: 2023-08-31 | Disposition: A | Discharge status: Home / Self Care | Payer: Commercial Managed Care - HMO | Source: Ambulatory Visit | Attending: Student | Admitting: Student

## 2023-08-31 ENCOUNTER — Ambulatory Visit: Payer: Managed Care, Other (non HMO) | Admitting: Orthopaedic Surgery

## 2023-08-31 ENCOUNTER — Encounter (HOSPITAL_BASED_OUTPATIENT_CLINIC_OR_DEPARTMENT_OTHER): Payer: Self-pay | Admitting: Student

## 2023-08-31 ENCOUNTER — Telehealth: Payer: Self-pay

## 2023-08-31 ENCOUNTER — Ambulatory Visit (HOSPITAL_BASED_OUTPATIENT_CLINIC_OR_DEPARTMENT_OTHER): Payer: Commercial Managed Care - HMO

## 2023-08-31 ENCOUNTER — Ambulatory Visit (HOSPITAL_BASED_OUTPATIENT_CLINIC_OR_DEPARTMENT_OTHER): Payer: Commercial Managed Care - HMO | Admitting: Student

## 2023-08-31 DIAGNOSIS — M25561 Pain in right knee: Secondary | ICD-10-CM

## 2023-08-31 DIAGNOSIS — M1711 Unilateral primary osteoarthritis, right knee: Secondary | ICD-10-CM

## 2023-08-31 MED ORDER — TRIAMCINOLONE ACETONIDE 40 MG/ML IJ SUSP
2.0000 mL | INTRAMUSCULAR | Status: AC | PRN
  Administered 2023-08-31: 2 mL via INTRA_ARTICULAR

## 2023-08-31 MED ORDER — LIDOCAINE HCL 1 % IJ SOLN
4.0000 mL | INTRAMUSCULAR | Status: AC | PRN
  Administered 2023-08-31: 4 mL

## 2023-08-31 NOTE — Progress Notes (Signed)
Chief Complaint: Right knee pain     History of Present Illness:    Cearia Harnack is a 62 y.o. female here today for evaluation of right knee pain.  She states that this pain began about 3 or 4 months ago without any known injury.  Pain is located throughout the right knee and she does feel popping sensations while walking.  Also notes that the knee has been frequently swelling and occasionally buckles.  Pain is worsened with long periods of standing, walking, or going up/down stairs.  Has tried compression and oral diclofenac.  She works as a Lawyer at [employer].  Denies any previous knee issues or surgery.   Surgical History:   None  PMH/PSH/Family History/Social History/Meds/Allergies:    Past Medical History:  Diagnosis Date   Angina at rest [facility]) 04/03/2020   Anxiety    Arthralgia of both hands 11/09/2016   Arthritis    Borderline diabetes 05/22/2022   Bronchitis    reports hx of frequent   Carpal tunnel syndrome 02/08/2013   Cervical radiculopathy 02/12/2020   Chest pain 04/03/2020   Chronic midline low back pain with bilateral sciatica 02/16/2022   Class 1 obesity due to excess calories with serious comorbidity and body mass index (BMI) of 30.0 to 30.9 in adult 09/19/2018   Depression    pt states she does not have history of depresssion 02-18-2014   Essential hypertension 05/14/2013   Excessive daytime sleepiness 03/22/2017   Facet arthropathy, lumbosacral 04/03/2022   Fibroid    GERD (gastroesophageal reflux disease)    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 Formatting of this note might be different from the original. Last Assessment & Plan: Improving with sertraline, continue at current dose with follow up  in 3 months. Last Assessment & Plan: Formatting of this note might be different from the original. Improving with sertraline, continue at current dose with follow up in 3 months. Format   Murmur 05/14/2013   Obesity (BMI 30-39.9) 02/16/2022   Sacroiliac joint dysfunction of both sides 02/24/2022   Sleep apnea    Vitamin D deficiency 08/21/2022   Past Surgical History:  Procedure Laterality Date   CARPAL TUNNEL RELEASE     bilateral   INDUCED ABORTION     X2   TOTAL ABDOMINAL HYSTERECTOMY     Social History   Socioeconomic History   Marital status: Single    Spouse name: Not on file   Number of children: 1   Years of education: Not on file   Highest education level: Not on file  Occupational History    Comment: Nurses aide  Tobacco Use   Smoking status: Never   Smokeless tobacco: Never  Vaping Use   Vaping status: Never Used  Substance and Sexual Activity   Alcohol use: No   Drug use: No   Sexual activity: Not Currently    Birth control/protection: Surgical  Other Topics Concern   Not on file  Social History Narrative   Not on file   Social Drivers of Health   Financial Resource Strain: Low Risk  (07/01/2021)  Received from [facility], [facility]   Overall Financial Resource Strain (CARDIA)    Difficulty of Paying Living Expenses: Not hard at all  Food Insecurity: No Food Insecurity (07/01/2021)   Received from [facility], [facility]   Hunger Vital Sign    Worried About Running Out of Food in the Last Year: Never true    Ran Out of Food in the Last Year: Never true  Transportation Needs: No Transportation Needs (07/01/2021)   Received from [facility], [facility]   [facility] - Transportation    Lack of Transportation (Medical): No    Lack of Transportation (Non-Medical): No  Physical Activity: Not on file  Stress: No Stress Concern Present (07/01/2021)   Received from [facility], [facility] -  Occupational Stress Questionnaire    Feeling of Stress : Not at all  Social Connections: Unknown (01/14/2022)   Received from [facility]   Social Network    Social Network: Not on file   Family History  Problem Relation Age of Onset   Stroke Mother    Lung cancer Father    Hypertension Sister    Colon cancer Neg Hx    Esophageal cancer Neg Hx    Stomach cancer Neg Hx    Allergies  Allergen Reactions   Lisinopril Other (See Comments) and Cough    cough    Current Outpatient Medications  Medication Sig Dispense Refill   acyclovir (ZOVIRAX) 800 MG tablet Take 1 tablet (800 mg total) by mouth daily. (Patient taking differently: Take 800 mg by mouth as needed (out breaks).) 90 tablet 1   albuterol (VENTOLIN HFA) 108 (90 Base) MCG/ACT inhaler Inhale 2 puffs into the lungs every 6 (six) hours as needed for wheezing or shortness of breath (Cough). 54 g 1   amLODipine (NORVASC) 10 MG tablet Take 1 tablet by mouth once daily 30 tablet 0   atorvastatin (LIPITOR) 20 MG tablet Take 1 tablet (20 mg total) by mouth daily. 90 tablet 1   buPROPion (WELLBUTRIN XL) 150 MG 24 hr tablet Take 1 tablet (150 mg total) by mouth daily. 90 tablet 0   cetirizine (ZYRTEC ALLERGY) 10 MG tablet Take 1 tablet (10 mg total) by mouth at bedtime. 90 tablet 1   diclofenac (VOLTAREN) 75 MG EC tablet Take 1 tablet (75 mg total) by mouth 2 (two) times daily. 30 tablet 0   esomeprazole (NEXIUM) 40 MG capsule Take 1 capsule (40 mg total) by mouth daily at 12 noon. 90 capsule 1   gabapentin (NEURONTIN) 300 MG capsule Take 1 capsule (300 mg total) by mouth 3 (three) times daily. 180 capsule 3   losartan-hydrochlorothiazide (HYZAAR) 100-25 MG tablet Take 1 tablet by mouth once daily 90 tablet 0   No current facility-administered medications for this visit.   No results found.  Review of Systems:   A ROS was performed including pertinent positives and negatives as documented in the HPI.  Physical Exam  :   Constitutional: NAD and appears stated age Neurological: Alert and oriented Psych: Appropriate affect and cooperative There were no vitals taken for this visit.   Comprehensive Musculoskeletal Exam:    Right knee exam demonstrates presence of a mild effusion without overlying erythema or warmth.  Active range of motion from 0 to 120 degrees with palpable crepitus.  No instability with varus or valgus stress.  Negative for pinpoint joint line tenderness bilaterally.  Knee flexion and extension strength  is 5/5.  Imaging:   Xray (right knee 4 views): Mild to moderate tricompartmental osteoarthritis with small joint line and patellofemoral osteophytes.   I personally reviewed and interpreted the radiographs.   Assessment:   62 y.o. female with 3 to 47-month history of atraumatic right knee pain.  This has been accompanied with swelling, popping, and buckling.  X-rays today demonstrate evidence of tricompartmental osteoarthritis which I discussed does align with her symptoms.  I have recommended continued low impact weightbearing exercise.  Also discussed that she would likely benefit significantly from a cortisone injection for pain and inflammation relief.  Patient is agreeable to this and injection was performed of the right knee today which she tolerated well.  Will plan to assess relief and see her back in clinic as needed.  Plan :    -Right knee cortisone injection performed today -Return to clinic as needed    Procedure Note  Patient: Denise Mejia             Date of Birth: [birth year]           MRN: [id number]             Visit Date: 08/31/2023  Procedures: Visit Diagnoses:  1. Unilateral primary osteoarthritis, right knee     Large Joint Inj: R knee on 08/31/2023 12:28 PM Indications: pain Details: 22 G 1.5 in needle, anterolateral approach Medications: 4 mL lidocaine 1 %; 2 mL triamcinolone acetonide 40 MG/ML Outcome: tolerated well, no immediate  complications Procedure, treatment alternatives, risks and benefits explained, specific risks discussed. Consent was given by the patient. Immediately prior to procedure a time out was called to verify the correct patient, procedure, equipment, support staff and site/side marked as required. Patient was prepped and draped in the usual sterile fashion.       I personally saw and evaluated the patient, and participated in the management and treatment plan.  Hazle Nordmann, PA-C Orthopedics

## 2023-08-31 NOTE — Telephone Encounter (Signed)
IC patient to reschedule patient was yelling and belligerent on the phone stating this is the 2nd time this has happened to her that we keep calling her with sad stories. Tried to help patient but she kept yelling, cursing and talking over me. Advised her did not have anyone here today that could see her but could let her be seen at [facility]. I did put her on [facility] schedule and she still insisted that you speak with her. Tried to tell her I understood her frustration and her inconvenience but she stated she I did not know because I was not her. She stated that the only concern I should have is know that she would be seen today by somebody.

## 2023-09-04 ENCOUNTER — Other Ambulatory Visit: Payer: Self-pay | Admitting: Family Medicine

## 2023-09-04 DIAGNOSIS — R1013 Epigastric pain: Secondary | ICD-10-CM

## 2023-09-04 DIAGNOSIS — K219 Gastro-esophageal reflux disease without esophagitis: Secondary | ICD-10-CM

## 2023-09-12 ENCOUNTER — Telehealth: Payer: Self-pay | Admitting: Neurology

## 2023-09-12 DIAGNOSIS — F3132 Bipolar disorder, current episode depressed, moderate: Secondary | ICD-10-CM | POA: Diagnosis not present

## 2023-09-12 NOTE — Telephone Encounter (Signed)
 She needs to establish with behavioral health. If she needs a new referral, we can do that.

## 2023-09-12 NOTE — Telephone Encounter (Signed)
 Patient recently seen, but depression not discussed.   She was referred to [facility] in October. Please advise.

## 2023-09-12 NOTE — Telephone Encounter (Signed)
 Spoke with patient. She states she has been seeing psychologist and counselor since referral sent. She asked the counselor about paperwork today and they told her they could not complete. I told her that she should speak with the psychologist who is managing her behavioral health medications and they should be able to complete. She expressed appreciation and will speak with them.

## 2023-09-12 NOTE — Telephone Encounter (Signed)
 Copied from CRM [id number]. Topic: Clinical - Medical Advice >> Sep 12, 2023  1:00 PM [name] C wrote: Reason for CRM: patient needs to speak with doctor about completing FMLA form for her for severe depression. Please advise with patient. Thank you

## 2023-09-14 ENCOUNTER — Other Ambulatory Visit: Payer: Self-pay | Admitting: Family Medicine

## 2023-09-27 ENCOUNTER — Encounter: Payer: Commercial Managed Care - HMO | Admitting: Neurology

## 2023-10-03 ENCOUNTER — Ambulatory Visit (INDEPENDENT_AMBULATORY_CARE_PROVIDER_SITE_OTHER): Payer: 59

## 2023-10-03 DIAGNOSIS — Z1231 Encounter for screening mammogram for malignant neoplasm of breast: Secondary | ICD-10-CM | POA: Diagnosis not present

## 2023-10-05 ENCOUNTER — Encounter: Payer: Commercial Managed Care - HMO | Admitting: Neurology

## 2023-10-05 ENCOUNTER — Encounter: Payer: Self-pay | Admitting: Neurology

## 2023-10-05 DIAGNOSIS — Z029 Encounter for administrative examinations, unspecified: Secondary | ICD-10-CM

## 2023-10-08 ENCOUNTER — Other Ambulatory Visit: Payer: Self-pay

## 2023-10-08 ENCOUNTER — Encounter (HOSPITAL_COMMUNITY): Payer: Self-pay | Admitting: Family

## 2023-10-08 ENCOUNTER — Ambulatory Visit (HOSPITAL_BASED_OUTPATIENT_CLINIC_OR_DEPARTMENT_OTHER): Payer: 59 | Admitting: Family

## 2023-10-08 VITALS — BP 141/76 | HR 71 | Ht 73.0 in | Wt 242.0 lb

## 2023-10-08 DIAGNOSIS — F322 Major depressive disorder, single episode, severe without psychotic features: Secondary | ICD-10-CM

## 2023-10-08 DIAGNOSIS — F419 Anxiety disorder, unspecified: Secondary | ICD-10-CM | POA: Diagnosis not present

## 2023-10-08 MED ORDER — FLUOXETINE HCL 10 MG PO CAPS: 10.0000 mg | ORAL_CAPSULE | Freq: Every day | ORAL | 0 refills | Status: DC

## 2023-10-08 NOTE — Progress Notes (Addendum)
Psychiatric Initial Adult Assessment   Patient Identification: Denise Mejia MRN:  [id number] Date of Evaluation:  10/08/2023 Referral Source: Leroy Kennedy NP  Chief Complaint:  Reported struggling with "server depression"   Visit Diagnosis:    ICD-10-CM   1. Anxiety  F41.9     2. Current severe episode of major depressive disorder without psychotic features without prior episode (HCC)  F32.2       History of Present Illness:  Denise Mejia 63 year old female presents to establish care.  Patient was seen and evaluated face-to-face by this provider.  She reports struggling with severe depression and anxiety symptoms.  States she has been diagnosed with depression and anxiety for over 4 years.  Reports she has taken Wellbutrin, Zoloft and a medication that starts with the F in the past.  Reports she was recently started on Wellbutrin and feels that the medication has side effects related to her breathing at night.    Denise Mejia reports struggling with depression and anxiety, irritability and poor concentration.  Reports she was followed by therapy in the past however states she needs " a therapist as more seasoned:" Reports concerns related to therapist age."  Only went 1 time she was like 52."  reports her primary care provider told her that she may have a personality disorder.  Reported she was followed by the [facility] however due to her insurance she had to change providers.   She denied previous inpatient admissions.  Denied previous hospitalizations.  Denied history related to suicidal or homicidal ideations.  Denied self injures behaviors.  Denied family history related to mental illness.  Reports she has a daughter 62 years old.  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  Patient encouraged to follow-up with therapy/psychology services.  PHQ-9 27 GAD-21  Major depressive disorder: Generalized anxiety disorder: Mood disorder  unspecified:  -Initiated Prozac 10 mg p.o. daily -Consideration with initiating mood stabilizer at follow-up appointment -Follow-up 2 months for medication adherence/tolerability. -Resources for therapy services   Denise Mejia is sitting, guarded very limited throughout this assessment.  Patient's responses were short, sharp and abrupt. she is alert/oriented x 3; calm/cooperative; and mood congruent with affect.  Patient is speaking in a clear tone at moderate volume, and normal pace; with fair eye contact. Her thought process is coherent and relevant; There is no indication that she is currently responding to internal/external stimuli or experiencing delusional thought content.  Patient denies suicidal/self-harm/homicidal ideation, psychosis, and paranoia.  Patient has remained calm throughout assessment and has answered questions appropriately.    Associated Signs/Symptoms: Depression Symptoms:  depressed mood, difficulty concentrating, anxiety, (Hypo) Manic Symptoms:  Distractibility, Irritable Mood, Anxiety Symptoms:  Excessive Worry, Psychotic Symptoms:  Hallucinations: None PTSD Symptoms: NA  Past Psychiatric History:   Previous Psychotropic Medications: No   Substance Abuse History in the last 12 months:  No.  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/16/2022   Class 1 obesity due to excess calories with serious comorbidity and body mass index (BMI)  of 30.0 to 30.9 in adult 09/19/2018   Depression    pt states she does not have history of depresssion 02-18-2014   Essential hypertension  05/14/2013   Excessive daytime sleepiness 03/22/2017   Facet arthropathy, lumbosacral 04/03/2022   Fibroid    GERD (gastroesophageal reflux disease)    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 Formatting of this note might be different from the original. Last Assessment & Plan: Improving with sertraline, continue at current dose with follow up in 3 months. Last Assessment & Plan: Formatting of this note might be different from the original. Improving with sertraline, continue at current dose with follow up in 3 months. Format   Murmur 05/14/2013   Obesity (BMI 30-39.9) 02/16/2022   Sacroiliac joint dysfunction of both sides 02/24/2022   Sleep apnea    Vitamin D deficiency 08/21/2022    Past Surgical History:  Procedure Laterality Date   CARPAL TUNNEL RELEASE     bilateral   INDUCED ABORTION     X2   TOTAL ABDOMINAL HYSTERECTOMY      Family Psychiatric History:   Family History:  Family History  Problem Relation Age of Onset   Stroke Mother    Lung cancer Father    Hypertension Sister    Colon cancer Neg Hx    Esophageal cancer Neg Hx    Stomach cancer Neg Hx     Social History:   Social History   Socioeconomic History   Marital status: Single    Spouse name: Not on file   Number of children: 1   Years of education: Not on file   Highest education level: Not on file  Occupational History    Comment: Nurses aide  Tobacco Use   Smoking status: Never   Smokeless tobacco: Never  Vaping Use   Vaping status: Never Used  Substance and Sexual Activity   Alcohol use: No   Drug use: No   Sexual activity: Not Currently    Birth control/protection: Surgical  Other Topics Concern   Not on file  Social History Narrative   Not on file   Social Drivers of  Health   Financial Resource Strain: Low Risk  (07/01/2021)   Received from [facility], [facility]   Overall Financial Resource Strain (CARDIA)    Difficulty of Paying Living Expenses: Not hard at all  Food Insecurity: No Food Insecurity (07/01/2021)   Received from [facility], [facility]   Hunger Vital Sign    Worried About Running Out of Food in the Last Year: Never true    Ran Out of Food in the Last Year: Never true  Transportation Needs: No Transportation Needs (07/01/2021)   Received from [facility], [facility]   PRAPARE - Transportation    Lack of Transportation (Medical): No    Lack of Transportation (Non-Medical): No  Physical Activity: Not on file  Stress: No Stress Concern Present (07/01/2021)   Received from [facility], [facility] - Occupational Stress Questionnaire    Feeling of Stress : Not at all  Social Connections: Unknown (01/14/2022)   Received from [facility], [facility]   Social Network    Social Network: Not on file    Additional Social History:  Allergies:   Allergies  Allergen Reactions   Lisinopril Other (See Comments) and Cough    cough     Metabolic Disorder Labs: Lab Results  Component Value Date   HGBA1C 6.4 08/28/2023   No results found for: "PROLACTIN" Lab Results  Component Value Date   CHOL 142 08/21/2022   TRIG 71.0 08/21/2022   HDL 49.30 08/21/2022   CHOLHDL 3 08/21/2022   VLDL 14.2 08/21/2022   LDLCALC 78 08/21/2022   LDLCALC 69 02/16/2022   Lab Results  Component Value Date   TSH 0.800 07/11/2023    Therapeutic Level Labs: No results found for: "LITHIUM" No results found for: "CBMZ" No results found for: "VALPROATE"  Current Medications: Current Outpatient Medications  Medication Sig Dispense Refill   acyclovir (ZOVIRAX) 800 MG tablet Take 1 tablet (800 mg total) by mouth daily. (Patient taking differently: Take 800 mg by mouth as needed (out  breaks).) 90 tablet 1   albuterol (VENTOLIN HFA) 108 (90 Base) MCG/ACT inhaler Inhale 2 puffs into the lungs every 6 (six) hours as needed for wheezing or shortness of breath (Cough). 54 g 1   amLODipine (NORVASC) 10 MG tablet Take 1 tablet (10 mg total) by mouth daily. 90 tablet 0   atorvastatin (LIPITOR) 20 MG tablet Take 1 tablet (20 mg total) by mouth daily. 90 tablet 1   buPROPion (WELLBUTRIN XL) 150 MG 24 hr tablet Take 1 tablet (150 mg total) by mouth daily. 90 tablet 0   esomeprazole (NEXIUM) 40 MG capsule Take 1 capsule (40 mg total) by mouth daily at 12 noon. 90 capsule 1   gabapentin (NEURONTIN) 300 MG capsule Take 1 capsule (300 mg total) by mouth 3 (three) times daily. 180 capsule 3   losartan-hydrochlorothiazide (HYZAAR) 100-25 MG tablet Take 1 tablet by mouth daily. 90 tablet 1   cetirizine (ZYRTEC ALLERGY) 10 MG tablet Take 1 tablet (10 mg total) by mouth at bedtime. (Patient not taking: Reported on 10/08/2023) 90 tablet 1   diclofenac (VOLTAREN) 75 MG EC tablet Take 1 tablet (75 mg total) by mouth 2 (two) times daily. (Patient not taking: Reported on 10/08/2023) 30 tablet 0   No current facility-administered medications for this visit.    Musculoskeletal: Strength & Muscle Tone: within normal limits Gait & Station: normal Patient leans: N/A  Psychiatric Specialty Exam: Review of Systems  Psychiatric/Behavioral:  Positive for decreased concentration. Negative for self-injury and suicidal ideas. The patient is nervous/anxious.   All other systems reviewed and are negative.   Blood pressure (!) 141/76, pulse 71, height 6\' 1"  (1.854 m), weight 242 lb (109.8 kg).Body mass index is 31.93 kg/m.  General Appearance: Casual  Eye Contact:  Good  Speech:  Clear and Coherent  Volume:  Normal  Mood:  Anxious and Depressed  Affect:  Congruent  Thought Process:  Coherent  Orientation:  Full (Time, Place, and Person)  Thought Content:  Logical  Suicidal Thoughts:  No  Homicidal  Thoughts:  No  Memory:  Immediate;   Good Recent;   Good  Judgement:  Good  Insight:  Good  Psychomotor Activity:  Normal  Concentration:  Concentration: Poor  Recall:  Fair  Fund of Knowledge:Good  Language: Good  Akathisia:  No  Handed:  Right  AIMS (if indicated):  not done  Assets:  Communication Skills Desire for Improvement Resilience Social Support  ADL's:  Intact  Cognition: WNL  Sleep:  Fair   Screenings: GAD-7    Garment/textile technologist Visit from  08/21/2022 in [facility] at [facility] Office Visit from 05/22/2022 in [facility] Primary Care at [facility]  Total GAD-7 Score 17 18      PHQ2-9    Flowsheet Row Office Visit [facility] 10/08/2023 in [facility] Office Visit from 08/21/2022 in [facility] Primary Care at [facility] Office Visit from 05/22/2022 in [facility] Primary Care at [facility]  PHQ-2 Total Score 6 5 5   PHQ-9 Total Score 27 19 19       Flowsheet Row Office Visit from 10/08/2023 in [facility] ED from 09/03/2022 in [facility] at [facility]) ED from 10/31/2021 in [facility] Emergency Department at [facility]  C-SSRS RISK CATEGORY Error: Q3, 4, or 5 should not be populated when Q2 is No No Risk No Risk       Assessment and Plan: Denise Mejia 63 year old African-American female presents to establish care.  Patient presents guarded, irritable reporting severe depression.  Denies that she is taking any psychotropic medications at this time.  Chart review patient was prescribed Wellbutrin however states she has not taken medications due to side effects.  No concerns related to suicidal ideations.  Intent or plan.  Discussed initiating Prozac 10 mg daily consideration for titration and initiating mood stabilization medication at follow-up appointment.  Appeared  receptive to plan.  Major depressive disorder: Generalized anxiety disorder: Mood disorder unspecified:  -Initiated Prozac 10 mg p.o. daily -Consideration with initiating mood stabilizer at follow-up appointment -Follow-up 2 months for medication adherence/tolerability. -Resources for therapy services  Collaboration of Care: Medication Management AEB patient to start Prozac 10 mg daily  Patient/Guardian was advised Release of Information must be obtained prior to any record release in order to collaborate their care with an outside provider. Patient/Guardian was advised if they have not already done so to contact the registration department to sign all necessary forms in order for [place] to release information regarding their care.   Consent: Patient/Guardian gives verbal consent for treatment and assignment of benefits for services provided during this visit. Patient/Guardian expressed understanding and agreed to proceed.   Denise Rack, NP 2/3/20251:50 PM

## 2023-10-23 ENCOUNTER — Encounter: Payer: Self-pay | Admitting: Gastroenterology

## 2023-10-23 ENCOUNTER — Ambulatory Visit (INDEPENDENT_AMBULATORY_CARE_PROVIDER_SITE_OTHER): Payer: 59 | Admitting: Gastroenterology

## 2023-10-23 VITALS — BP 114/56 | HR 80 | Ht 71.25 in | Wt 238.4 lb

## 2023-10-23 DIAGNOSIS — K30 Functional dyspepsia: Secondary | ICD-10-CM

## 2023-10-23 DIAGNOSIS — R1013 Epigastric pain: Secondary | ICD-10-CM

## 2023-10-23 NOTE — Progress Notes (Signed)
[place] [facility] Progress Note  Chief Complaint: Epigastric pain/dyspepsia  Subjective  Prior history  Previous GI history in the [facility] system, outlined in Dr. Irving Burton initial office consult note of 06/27/2023  Transferred care to [place] at that time for ongoing dyspepsia  EGD with Dr. Myrtie Neither 07/16/2023 normal-biopsies negative for H. pylori (including IHC stains) _________________________   Denise Mejia has felt generally well since I last saw her.  Couple episodes of dyspepsia with burning epigastric discomfort, and she never seems to have clear triggers or relieving factors.  It has been good for at least the last month, though not clear why.  Appetite good, denies dysphagia or odynophagia.  ROS: Cardiovascular:  no chest pain Respiratory: no dyspnea  The patient's Past Medical, Family and Social History were reviewed and are on file in the EMR. Past Medical History:  Diagnosis Date   Angina at rest [facility]) 04/03/2020   Anxiety    Arthralgia of both hands 11/09/2016   Arthritis    Borderline diabetes 05/22/2022   Bronchitis    reports hx of frequent   Carpal tunnel syndrome 02/08/2013   Cervical radiculopathy 02/12/2020   Chest pain 04/03/2020   Chronic midline low back pain with bilateral sciatica 02/16/2022   Class 1 obesity due to excess calories with serious comorbidity and body mass index (BMI) of 30.0 to 30.9 in adult 09/19/2018   Depression    pt states she does not have history of depresssion 02-18-2014   Essential hypertension 05/14/2013   Excessive daytime sleepiness 03/22/2017   Facet arthropathy, lumbosacral 04/03/2022   Fibroid    GERD (gastroesophageal reflux disease)    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 Formatting of this note might be  different from the original. Last Assessment & Plan: Improving with sertraline, continue at current dose with follow up in 3 months. Last Assessment & Plan: Formatting of this note might be different from the original. Improving with sertraline, continue at current dose with follow up in 3 months. Format   Murmur 05/14/2013   Obesity (BMI 30-39.9) 02/16/2022   Sacroiliac joint dysfunction of both sides 02/24/2022   Sleep apnea    Vitamin D deficiency 08/21/2022    Past Surgical History:  Procedure Laterality Date   CARPAL TUNNEL RELEASE     bilateral   INDUCED ABORTION     X2   TOTAL ABDOMINAL HYSTERECTOMY       Objective:  Med list reviewed  Current Outpatient Medications:    acyclovir (ZOVIRAX) 800 MG tablet, Take 1 tablet (800 mg total) by mouth daily. (Patient taking differently: Take 800 mg by mouth as needed (out breaks).), Disp: 90 tablet, Rfl: 1   albuterol (VENTOLIN HFA) 108 (90 Base) MCG/ACT inhaler, Inhale 2 puffs into the lungs every 6 (six) hours as needed for wheezing or shortness of breath (Cough)., Disp: 54 g, Rfl: 1   amLODipine (NORVASC) 10 MG tablet, Take 1 tablet (10 mg total) by mouth daily., Disp: 90 tablet, Rfl: 0   atorvastatin (LIPITOR) 20 MG tablet, Take 1 tablet (20 mg total) by mouth daily., Disp: 90 tablet, Rfl: 1   buPROPion (WELLBUTRIN XL) 150 MG 24 hr tablet, Take 1 tablet (150 mg total) by mouth daily., Disp: 90 tablet, Rfl: 0   diclofenac (VOLTAREN) 75  MG EC tablet, Take 1 tablet (75 mg total) by mouth 2 (two) times daily., Disp: 30 tablet, Rfl: 0   esomeprazole (NEXIUM) 40 MG capsule, Take 1 capsule (40 mg total) by mouth daily at 12 noon., Disp: 90 capsule, Rfl: 1   FLUoxetine (PROZAC) 10 MG capsule, Take 1 capsule (10 mg total) by mouth daily., Disp: 30 capsule, Rfl: 0   gabapentin (NEURONTIN) 300 MG capsule, Take 1 capsule (300 mg total) by mouth 3 (three) times daily., Disp: 180 capsule, Rfl: 3   losartan-hydrochlorothiazide (HYZAAR) 100-25 MG  tablet, Take 1 tablet by mouth daily., Disp: 90 tablet, Rfl: 1   cetirizine (ZYRTEC ALLERGY) 10 MG tablet, Take 1 tablet (10 mg total) by mouth at bedtime. (Patient not taking: Reported on 10/08/2023), Disp: 90 tablet, Rfl: 1   Vital signs in last 24 hrs: Vitals:   10/23/23 1541  BP: (!) 114/56  Pulse: 80   Wt Readings from Last 3 Encounters:  10/23/23 238 lb 6 oz (108.1 kg)  08/28/23 239 lb (108.4 kg)  07/16/23 229 lb (103.9 kg)    Physical Exam  Well-appearing HEENT: sclera anicteric, oral mucosa moist without lesions Neck: supple, no thyromegaly, JVD or lymphadenopathy Cardiac: Regular without appreciable murmur,  no peripheral edema Pulm: clear to auscultation bilaterally, normal RR and effort noted Abdomen: soft, no tenderness, with active bowel sounds. No guarding or palpable hepatosplenomegaly. Skin; warm and dry, no jaundice or rash   Labs:   ___________________________________________ Radiologic studies:   ____________________________________________ Other: EGD and biopsies as noted above  _____________________________________________   Encounter Diagnosis  Name Primary?   Nonulcer dyspepsia Yes    Assessment and Plan Chronic stable symptoms that seem to wax and wane, lately has been doing well. We discussed the unclear nature nonulcer dyspepsia and limitation of acid suppression therapy.  While she intermittently has had some reflux symptoms in the past, this has lately been behaving more like a dyspepsia, so I would not increase the dose of acid suppression as there is likely little benefit in doing so.  She would also prefer not to be on more prescription meds if possible.  Does not sound typical for biliary colic, H. pylori negative.  No red flag symptoms.  I gave her some samples of FD guard and IBgard to try if she gets another flare of this.   See me as needed    Charlie Pitter III

## 2023-11-02 ENCOUNTER — Encounter: Payer: Self-pay | Admitting: Family Medicine

## 2023-11-06 ENCOUNTER — Telehealth: Payer: Self-pay | Admitting: Neurology

## 2023-11-06 NOTE — Telephone Encounter (Signed)
 Copied from CRM [id number]. Topic: Appointments - Appointment Scheduling >> Nov 06, 2023  9:48 AM Kathryne Eriksson wrote: Patient/patient representative is calling to schedule an appointment. Refer to attachments for appointment information. >> Nov 06, 2023  9:54 AM Kathryne Eriksson wrote: Patient is wanting to schedule to complete a physical for employment. I offered her the earliest appointment I had on my end which was March 19th but patient states she needs something sooner than that. Patient call back number is [phone]

## 2023-11-07 ENCOUNTER — Encounter: Payer: Self-pay | Admitting: Family Medicine

## 2023-11-07 ENCOUNTER — Ambulatory Visit (INDEPENDENT_AMBULATORY_CARE_PROVIDER_SITE_OTHER): Admitting: Family Medicine

## 2023-11-07 VITALS — BP 138/60 | HR 66 | Ht 71.25 in | Wt 239.0 lb

## 2023-11-07 DIAGNOSIS — Z0000 Encounter for general adult medical examination without abnormal findings: Secondary | ICD-10-CM

## 2023-11-07 DIAGNOSIS — Z13220 Encounter for screening for lipoid disorders: Secondary | ICD-10-CM

## 2023-11-07 DIAGNOSIS — J309 Allergic rhinitis, unspecified: Secondary | ICD-10-CM | POA: Diagnosis not present

## 2023-11-07 DIAGNOSIS — F32A Depression, unspecified: Secondary | ICD-10-CM

## 2023-11-07 MED ORDER — CETIRIZINE HCL 10 MG PO TABS: 10.0000 mg | ORAL_TABLET | Freq: Every day | ORAL | 1 refills | Status: AC

## 2023-11-07 NOTE — Assessment & Plan Note (Signed)
 PHQ9/GAD7 discussed. She is following with [facility] - just started SSRI and due to follow-up in 1 month. No SI/HI.

## 2023-11-07 NOTE — Progress Notes (Signed)
 Complete physical exam  Patient: Denise Mejia   DOB: 1961-04-09   63 y.o. Female  MRN: [id number]  Subjective:    Chief Complaint  Patient presents with   Annual Exam    Denise Mejia is a 63 y.o. female who presents today for a complete physical exam. She reports consuming a general diet. The patient has a physically strenuous job, but has no regular exercise apart from work.  She generally feels fairly well. She reports sleeping fairly well. She does not have additional problems to discuss today.   Currently lives with: alone Acute concerns or interim problems since last visit: no  Vision concerns: no Dental concerns: no STD concerns: no  ETOH use: no Nicotine use: no Recreational drugs/illegal substances:     Most recent fall risk assessment:    11/07/2023    2:01 PM  Fall Risk   Falls in the past year? 0  Number falls in past yr: 0  Injury with Fall? 0  Risk for fall due to : No Fall Risks  Follow up Falls evaluation completed     Most recent depression screenings:    11/07/2023    2:26 PM 10/08/2023    1:22 PM  PHQ 2/9 Scores  PHQ - 2 Score 6   PHQ- 9 Score 24      Information is confidential and restricted. Go to Review Flowsheets to unlock data.       11/07/2023    2:26 PM 08/21/2022    9:14 AM 05/22/2022   10:53 AM  GAD 7 : Generalized Anxiety Score  Nervous, Anxious, on Edge 3 3 2   Control/stop worrying 3 3 3   Worry too much - different things 3 3 3   Trouble relaxing 3 3 2   Restless 0 1 2  Easily annoyed or irritable 3 3 3   Afraid - awful might happen 3 1 3   Total GAD 7 Score 18 17 18   Anxiety Difficulty Not difficult at all Somewhat difficult Somewhat difficult   Following with [facility]. Recently started Prozac. Trying to start counseling. No SI/HI.          Patient Care Team: Clayborne Dana, NP as PCP - General (Family Medicine) Baldo Daub, MD as PCP - Cardiology (Cardiology) [facility], Roe Rutherford, PA as Physician Assistant (Cardiology)    Outpatient Medications Prior to Visit  Medication Sig   acyclovir (ZOVIRAX) 800 MG tablet Take 1 tablet (800 mg total) by mouth daily. (Patient taking differently: Take 800 mg by mouth as needed (out breaks).)   albuterol (VENTOLIN HFA) 108 (90 Base) MCG/ACT inhaler Inhale 2 puffs into the lungs every 6 (six) hours as needed for wheezing or shortness of breath (Cough).   amLODipine (NORVASC) 10 MG tablet Take 1 tablet (10 mg total) by mouth daily.   atorvastatin (LIPITOR) 20 MG tablet Take 1 tablet (20 mg total) by mouth daily.   diclofenac (VOLTAREN) 75 MG EC tablet Take 1 tablet (75 mg total) by mouth 2 (two) times daily.   esomeprazole (NEXIUM) 40 MG capsule Take 1 capsule (40 mg total) by mouth daily at 12 noon.   FLUoxetine (PROZAC) 10 MG capsule Take 1 capsule (10 mg total) by mouth daily.   gabapentin (NEURONTIN) 300 MG capsule Take 1 capsule (300 mg total) by mouth 3 (three) times daily.   losartan-hydrochlorothiazide (HYZAAR) 100-25 MG tablet Take 1 tablet by mouth daily.   [DISCONTINUED] buPROPion (WELLBUTRIN XL) 150 MG 24 hr tablet Take 1 tablet (150 mg total)  by mouth daily.   [DISCONTINUED] cetirizine (ZYRTEC ALLERGY) 10 MG tablet Take 1 tablet (10 mg total) by mouth at bedtime. (Patient not taking: Reported on 10/08/2023)   No facility-administered medications prior to visit.    ROS All review of systems negative except what is listed in the HPI        Objective:     BP 138/60   Pulse 66   Ht 5' 11.25" (1.81 m)   Wt 239 lb (108.4 kg)   SpO2 100%   BMI 33.10 kg/m    Physical Exam Vitals reviewed.  Constitutional:      General: She is not in acute distress.    Appearance: Normal appearance. She is not ill-appearing.  HENT:     Head: Normocephalic and atraumatic.     Right Ear: Tympanic membrane normal.     Left Ear: Tympanic membrane normal.     Nose: Nose normal.     Mouth/Throat:     Mouth: Mucous membranes are moist.     Pharynx: Oropharynx is clear.   Eyes:     Extraocular Movements: Extraocular movements intact.     Conjunctiva/sclera: Conjunctivae normal.     Pupils: Pupils are equal, round, and reactive to light.  Cardiovascular:     Rate and Rhythm: Normal rate and regular rhythm.     Pulses: Normal pulses.  Pulmonary:     Effort: Pulmonary effort is normal.     Breath sounds: Normal breath sounds.  Abdominal:     General: Abdomen is flat. Bowel sounds are normal. There is no distension.     Palpations: Abdomen is soft. There is no mass.     Tenderness: There is no abdominal tenderness. There is no right CVA tenderness, left CVA tenderness, guarding or rebound.  Genitourinary:    Comments: Deferred exam Musculoskeletal:        General: Normal range of motion.     Cervical back: Normal range of motion and neck supple. No tenderness.     Right lower leg: No edema.     Left lower leg: No edema.  Lymphadenopathy:     Cervical: No cervical adenopathy.  Skin:    General: Skin is warm and dry.     Capillary Refill: Capillary refill takes less than 2 seconds.  Neurological:     General: No focal deficit present.     Mental Status: She is alert and oriented to person, place, and time. Mental status is at baseline.  Psychiatric:        Mood and Affect: Mood normal.        Behavior: Behavior normal.        Thought Content: Thought content normal.        Judgment: Judgment normal.            No results found for any visits on 11/07/23.     Assessment & Plan:    Routine Health Maintenance and Physical Exam Discussed health promotion and safety including diet and exercise recommendations, dental health, and injury prevention. Tobacco cessation if applicable. Seat belts, sunscreen, smoke detectors, etc.    Immunization History  Administered Date(s) Administered   DTaP 09/26/1965, 10/29/1967, 03/24/1968   IPV 09/26/1965, 03/24/1968, 09/17/1978   Influenza Inj Mdck Quad Pf 07/08/2021   Influenza, Seasonal, Injecte,  Preservative Fre 10/30/2008, 09/17/2009, 05/01/2023   Measles 11/19/1970   Moderna Sars-Covid-2 Vaccination 04/22/2020, 05/20/2020   Novel Infuenza-h1n1-09 08/18/2008   Pneumococcal Polysaccharide-23 10/30/2008   Rubella 11/19/1970   Smallpox  12/29/1968   Td 12/17/1978, 04/02/1992   Tdap 01/12/2011, 05/01/2023    Health Maintenance  Topic Date Due   Zoster Vaccines- Shingrix (1 of 2) 02/06/2024 (Originally 04/01/1982)   COVID-19 Vaccine (3 - Moderna risk series) 05/16/2024 (Originally 06/17/2020)   Pneumococcal Vaccine 44-40 Years old (2 of 2 - PCV) 11/05/2024 (Originally 10/30/2009)   MAMMOGRAM  10/02/2025   Colonoscopy  11/23/2027   DTaP/Tdap/Td (8 - Td or Tdap) 04/30/2033   INFLUENZA VACCINE  Completed   Hepatitis C Screening  Completed   HIV Screening  Completed   HPV VACCINES  Aged Out        Problem List Items Addressed This Visit       Active Problems   Depression   PHQ9/GAD7 discussed. She is following with [facility] - just started SSRI and due to follow-up in 1 month. No SI/HI.      Other Visit Diagnoses       Annual physical exam    -  Primary   Relevant Orders   Lipid panel     Screening, lipid       Relevant Orders   Lipid panel     Allergic rhinitis, unspecified seasonality, unspecified trigger       Relevant Medications   cetirizine (ZYRTEC ALLERGY) 10 MG tablet        PATIENT COUNSELING:    Sexuality: Discussed sexually transmitted diseases, partner selection, use of condoms, avoidance of unintended pregnancy, and contraceptive alternatives.   I discussed with the patient that most people either abstain from alcohol or drink within safe limits (<=14/week and <=4 drinks/occasion for males, <=7/weeks and <= 3 drinks/occasion for females) and that the risk for alcohol disorders and other health effects rises proportionally with the number of drinks per week and how often a drinker exceeds daily limits.  Discussed cessation/primary prevention of drug use  and availability of treatment for abuse.   Diet: Encouraged to adjust caloric intake to maintain or achieve ideal body weight, to reduce intake of dietary saturated fat and total fat, to limit sodium intake by avoiding high sodium foods and not adding table salt, and to maintain adequate dietary potassium and calcium preferably from fresh fruits, vegetables, and low-fat dairy products. Encouraged vitamin D 1000 units and Calcium 1300mg  or 4 servings of dairy a day.  Emphasized the importance of regular exercise.  Injury prevention: Discussed safety belts, safety helmets, smoke detector, smoking near bedding or upholstery.   Dental health: Discussed importance of regular tooth brushing, flossing, and dental visits.       Return in about 6 months (around 05/09/2024) for routine follow-up.     Clayborne Dana, NP

## 2023-11-09 ENCOUNTER — Other Ambulatory Visit (INDEPENDENT_AMBULATORY_CARE_PROVIDER_SITE_OTHER)

## 2023-11-09 DIAGNOSIS — Z0000 Encounter for general adult medical examination without abnormal findings: Secondary | ICD-10-CM

## 2023-11-09 DIAGNOSIS — Z13220 Encounter for screening for lipoid disorders: Secondary | ICD-10-CM | POA: Diagnosis not present

## 2023-11-09 LAB — LIPID PANEL
Cholesterol: 150 mg/dL (ref 0–200)
HDL: 50.7 mg/dL (ref >39.00)
LDL Cholesterol: 85 mg/dL (ref 0–99)
NonHDL: 98.85
Total CHOL/HDL Ratio: 3
Triglycerides: 69 mg/dL (ref 0.0–149.0)
VLDL: 13.8 mg/dL (ref 0.0–40.0)

## 2023-11-12 ENCOUNTER — Encounter: Payer: Self-pay | Admitting: Family Medicine

## 2023-11-12 ENCOUNTER — Telehealth: Payer: Self-pay | Admitting: Neurology

## 2023-11-12 ENCOUNTER — Other Ambulatory Visit (HOSPITAL_COMMUNITY): Payer: Self-pay | Admitting: Family

## 2023-11-12 ENCOUNTER — Other Ambulatory Visit (HOSPITAL_COMMUNITY): Payer: Self-pay

## 2023-11-12 MED ORDER — FLUOXETINE HCL 10 MG PO CAPS: 10.0000 mg | ORAL_CAPSULE | Freq: Every day | ORAL | 0 refills | Status: DC

## 2023-11-12 NOTE — Telephone Encounter (Signed)
 Copied from CRM [id number]. Topic: General - Other >> Nov 12, 2023  3:55 PM Jon Gills C wrote: Reason for CRM: Patient called in regarding missed call , informed patient lipid panel is stable , patient stated she understood and had no other questions

## 2023-12-05 ENCOUNTER — Encounter: Payer: Self-pay | Admitting: Family Medicine

## 2023-12-05 ENCOUNTER — Other Ambulatory Visit: Payer: Self-pay | Admitting: Family Medicine

## 2023-12-05 ENCOUNTER — Ambulatory Visit (INDEPENDENT_AMBULATORY_CARE_PROVIDER_SITE_OTHER): Admitting: Family Medicine

## 2023-12-05 VITALS — BP 123/69 | HR 75 | Temp 98.0°F | Ht 71.25 in | Wt 236.0 lb

## 2023-12-05 DIAGNOSIS — J0140 Acute pansinusitis, unspecified: Secondary | ICD-10-CM

## 2023-12-05 MED ORDER — ALBUTEROL SULFATE HFA 108 (90 BASE) MCG/ACT IN AERS: 2.0000 | INHALATION_SPRAY | Freq: Four times a day (QID) | RESPIRATORY_TRACT | 1 refills | Status: DC | PRN

## 2023-12-05 MED ORDER — BENZONATATE 200 MG PO CAPS: 200.0000 mg | ORAL_CAPSULE | Freq: Two times a day (BID) | ORAL | 0 refills | Status: DC | PRN

## 2023-12-05 MED ORDER — ALBUTEROL SULFATE HFA 108 (90 BASE) MCG/ACT IN AERS: 2.0000 | INHALATION_SPRAY | Freq: Four times a day (QID) | RESPIRATORY_TRACT | 1 refills | Status: AC | PRN

## 2023-12-05 MED ORDER — FLUTICASONE PROPIONATE 50 MCG/ACT NA SUSP
2.0000 | Freq: Every day | NASAL | 6 refills | Status: AC
Start: 2023-12-05 — End: ?

## 2023-12-05 MED ORDER — AMOXICILLIN-POT CLAVULANATE 875-125 MG PO TABS: 1.0000 | ORAL_TABLET | Freq: Two times a day (BID) | ORAL | 0 refills | Status: AC

## 2023-12-05 NOTE — Telephone Encounter (Signed)
 Copied from CRM [id number]. Topic: Clinical - Medication Refill >> Dec 05, 2023  9:04 AM Myrtice Lauth wrote: Most Recent Primary Care Visit:  Provider: [facility]  Department: [facility]  Visit Type: LAB VISIT  Date: 11/09/2023  Medication: albuterol (VENTOLIN HFA) 108 (90 Base) MCG/ACT inhaler  Has the patient contacted their pharmacy? Yes (Agent: If no, request that the patient contact the pharmacy for the refill. If patient does not wish to contact the pharmacy document the reason why and proceed with request.) (Agent: If yes, when and what did the pharmacy advise?)  Is this the correct pharmacy for this prescription? Yes If no, delete pharmacy and type the correct one.  This is the patient's preferred pharmacy:  [id number] - [place], [place] - [address] [place] [address] Phone: [phone] Fax: [phone]   Has the prescription been filled recently? Yes  Is the patient out of the medication? Yes  Has the patient been seen for an appointment in the last year OR does the patient have an upcoming appointment? Yes  Can we respond through MyChart? Yes  Agent: Please be advised that Rx refills may take up to 3 business days. We ask that you follow-up with your pharmacy.

## 2023-12-05 NOTE — Progress Notes (Signed)
 Acute Office Visit  Subjective:     Patient ID: Denise Mejia, female    DOB: 1961/03/26, 63 y.o.   MRN: [id number]  Chief Complaint  Patient presents with   Sinus Problem    Patient is in today for sinus headache, cough.  Discussed the use of AI scribe software for clinical note transcription with the patient, who gave verbal consent to proceed.  History of Present Illness Denise Mejia is a 63 year old female who presents with sinus congestion and ear pain.  She has been experiencing sinus congestion and ear pain for about one and a half to two weeks. The symptoms initially appeared about a week after her last physical exam, resolved, and then recurred. Symptoms include cough, congestion, sinus pain, and a sore throat, primarily affecting the right side. Her right ear feels clogged, causing sounds to 'ricochet' off her ear.  She has been using an inhaler, which is now empty, and has been taking Zyrtec at night, which have not been effective. Her mucus is clear, and she has both a runny and stopped-up nose. No known contact with sick individuals and no fever. She rates her headache and sinus pain as a 9.5 out of 10. Despite the severity of her symptoms, she has not missed work and does not require a note for work absence.  She is not allergic to any antibiotics and has not taken any recent antibiotics.      ROS All review of systems negative except what is listed in the HPI      Objective:    BP 123/69   Pulse 75   Temp 98 F (36.7 C) (Oral)   Ht 5' 11.25" (1.81 m)   Wt 236 lb (107 kg)   SpO2 100%   BMI 32.68 kg/m    Physical Exam Vitals reviewed.  Constitutional:      General: She is not in acute distress.    Appearance: Normal appearance. She is ill-appearing.  HENT:     Head: Normocephalic and atraumatic.     Right Ear: Tympanic membrane normal.     Left Ear: Tympanic membrane normal.     Ears:     Comments: Some cerumen in right ear, but not obstructive     Nose: Congestion and rhinorrhea present.     Mouth/Throat:     Pharynx: Oropharynx is clear. No oropharyngeal exudate or posterior oropharyngeal erythema.  Cardiovascular:     Rate and Rhythm: Normal rate and regular rhythm.     Heart sounds: Murmur heard.  Pulmonary:     Effort: Pulmonary effort is normal.     Breath sounds: Normal breath sounds.  Skin:    General: Skin is warm and dry.  Neurological:     Mental Status: She is alert and oriented to person, place, and time.  Psychiatric:        Mood and Affect: Mood normal.        Behavior: Behavior normal.        Thought Content: Thought content normal.        Judgment: Judgment normal.         No results found for any visits on 12/05/23.      Assessment & Plan:   Problem List Items Addressed This Visit   None Visit Diagnoses       Acute non-recurrent pansinusitis    -  Primary   Relevant Medications   albuterol (VENTOLIN HFA) 108 (90 Base) MCG/ACT inhaler   fluticasone (  FLONASE) 50 MCG/ACT nasal spray   benzonatate (TESSALON) 200 MG capsule   amoxicillin-clavulanate (AUGMENTIN) 875-125 MG tablet      Assessment & Plan Acute Sinusitis Symptoms and examination findings indicate acute sinusitis with significant sinus pain and congestion. - Prescribed Augmentin. Instructed to take with food and water. - Advised Flonase nasal spray for nasal inflammation. - Refilled albuterol inhaler. - Recommended OTC Mucinex for mucus clearance. - Advised increased fluid intake. - Provided cough pills for symptom relief.  Patient aware of signs/symptoms requiring further/urgent evaluation.   Meds ordered this encounter  Medications   albuterol (VENTOLIN HFA) 108 (90 Base) MCG/ACT inhaler    Sig: Inhale 2 puffs into the lungs every 6 (six) hours as needed for wheezing or shortness of breath (Cough).    Dispense:  54 g    Refill:  1    Brand substitution permitted as needed to meet patient's Rx insurance formulary  requirements    Supervising Provider:   Danise Edge A [[id number]]   fluticasone (FLONASE) 50 MCG/ACT nasal spray    Sig: Place 2 sprays into both nostrils daily.    Dispense:  16 g    Refill:  6    Supervising Provider:   Danise Edge A [[id number]]   benzonatate (TESSALON) 200 MG capsule    Sig: Take 1 capsule (200 mg total) by mouth 2 (two) times daily as needed for cough.    Dispense:  20 capsule    Refill:  0    Supervising Provider:   Danise Edge A [[id number]]   amoxicillin-clavulanate (AUGMENTIN) 875-125 MG tablet    Sig: Take 1 tablet by mouth 2 (two) times daily.    Dispense:  20 tablet    Refill:  0    Supervising Provider:   Danise Edge A [[id number]]    Return if symptoms worsen or fail to improve.  Denise Dana, NP

## 2023-12-10 ENCOUNTER — Ambulatory Visit (HOSPITAL_COMMUNITY): Payer: 59 | Admitting: Family

## 2023-12-14 ENCOUNTER — Other Ambulatory Visit: Payer: Self-pay | Admitting: Family Medicine

## 2023-12-17 ENCOUNTER — Encounter: Payer: Self-pay | Admitting: Family Medicine

## 2023-12-17 ENCOUNTER — Other Ambulatory Visit (HOSPITAL_COMMUNITY): Payer: Self-pay | Admitting: Family

## 2023-12-17 ENCOUNTER — Ambulatory Visit (HOSPITAL_BASED_OUTPATIENT_CLINIC_OR_DEPARTMENT_OTHER): Admitting: Family

## 2023-12-17 VITALS — BP 167/70 | HR 82 | Ht 71.0 in | Wt 244.0 lb

## 2023-12-17 DIAGNOSIS — F322 Major depressive disorder, single episode, severe without psychotic features: Secondary | ICD-10-CM

## 2023-12-17 DIAGNOSIS — F419 Anxiety disorder, unspecified: Secondary | ICD-10-CM

## 2023-12-17 MED ORDER — FLUOXETINE HCL 10 MG PO CAPS: 10.0000 mg | ORAL_CAPSULE | Freq: Every day | ORAL | 1 refills | Status: AC

## 2023-12-17 NOTE — Progress Notes (Cosign Needed Addendum)
 BH MD/PA/NP OP Progress Note  12/17/2023 3:49 PM Denise Mejia  MRN:  [id number]  Chief Complaint: Medication management   HPI: Denise Mejia 63 year old African-American female presents for medication management follow-up appointment.  She was initiated on Prozac 10 mg daily which she reports she has been taking and tolerating well.  States overall her mood has improved.  States she is not crying as much and feels like her mood is stabilized.  States she has constant follow-up with therapy services that she just recently received therapy contact information in her email.  [place] reports good appetite.  States she is resting well throughout the night.  "  Sleeping too much" however attributes to psychosocial stressors.  States she is currently residing in a boardinghouse.  Reports her car recently broke down but overall has been good spirits.  Discussed titrating Prozac from 10 mg to 20 mg.  Patient declines states she feels a current medication dose is appropriate.  Presents less guarded throughout this assessment slightly more reactive to questions.  Speaking in clear moderate tone.  Fair eye contact.  No concerns related to suicidal or homicidal ideations.  Will continue to manage symptoms.  Patient to follow-up 3 months for medication management.  Supportive encouragement reassurance was provided.  Visit Diagnosis:    ICD-10-CM   1. Current severe episode of major depressive disorder without psychotic features without prior episode (HCC)  F32.2     2. Anxiety  F41.9       Past Psychiatric History:   Past Medical History:  Past Medical History:  Diagnosis Date   Angina at rest [facility]) 04/03/2020   Anxiety    Arthralgia of both hands 11/09/2016   Arthritis    Borderline diabetes 05/22/2022   Bronchitis    reports hx of frequent   Carpal tunnel syndrome 02/08/2013   Cervical radiculopathy 02/12/2020   Chest pain 04/03/2020   Chronic midline low back pain with bilateral sciatica 02/16/2022    Class 1 obesity due to excess calories with serious comorbidity and body mass index (BMI) of 30.0 to 30.9 in adult 09/19/2018   Depression    pt states she does not have history of depresssion 02-18-2014   Essential hypertension 05/14/2013   Excessive daytime sleepiness 03/22/2017   Facet arthropathy, lumbosacral 04/03/2022   Fibroid    GERD (gastroesophageal reflux disease)    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 Formatting of this note might be different from the original. Last Assessment & Plan: Improving with sertraline, continue at current dose with follow up in 3 months. Last Assessment & Plan: Formatting of this note might be different from the original. Improving with sertraline, continue at current dose with follow up in 3 months. Format   Murmur 05/14/2013   Obesity (BMI 30-39.9) 02/16/2022   Sacroiliac joint dysfunction of both sides 02/24/2022   Sleep apnea    Vitamin D deficiency 08/21/2022    Past Surgical History:  Procedure Laterality Date   CARPAL TUNNEL RELEASE     bilateral   INDUCED ABORTION     X2   TOTAL ABDOMINAL HYSTERECTOMY      Family Psychiatric History:  Family History:  Family History  Problem Relation Age of Onset   Stroke Mother    Lung cancer Father  Hypertension Sister    Colon cancer Neg Hx    Esophageal cancer Neg Hx    Stomach cancer Neg Hx     Social History:  Social History   Socioeconomic History   Marital status: Single    Spouse name: Not on file   Number of children: 1   Years of education: Not on file   Highest education level: Not on file  Occupational History    Comment: Nurses aide  Tobacco Use   Smoking status: Never   Smokeless tobacco: Never  Vaping Use   Vaping status: Never Used   Substance and Sexual Activity   Alcohol use: No   Drug use: No   Sexual activity: Not Currently    Birth control/protection: Surgical  Other Topics Concern   Not on file  Social History Narrative   Not on file   Social Drivers of Health   Financial Resource Strain: Low Risk  (07/01/2021)   Received from [facility]   Overall Financial Resource Strain (CARDIA)    Difficulty of Paying Living Expenses: Not hard at all  Food Insecurity: No Food Insecurity (07/01/2021)   Received from [facility]   Hunger Vital Sign    Worried About Running Out of Food in the Last Year: Never true    Ran Out of Food in the Last Year: Never true  Transportation Needs: No Transportation Needs (07/01/2021)   Received from [facility]   PRAPARE - Transportation    Lack of Transportation (Medical): No    Lack of Transportation (Non-Medical): No  Physical Activity: Not on file  Stress: No Stress Concern Present (07/01/2021)   Received from [facility] - Occupational Stress Questionnaire    Feeling of Stress : Not at all  Social Connections: Unknown (01/14/2022)   Received from [facility]   Social Network    Social Network: Not on file    Allergies:  Allergies  Allergen Reactions   Lisinopril Other (See Comments) and Cough    cough     Metabolic Disorder Labs: Lab Results  Component Value Date   HGBA1C 6.4 08/28/2023   No results found for: "PROLACTIN" Lab Results  Component Value Date   CHOL 150 11/09/2023   TRIG 69.0 11/09/2023   HDL 50.70 11/09/2023   CHOLHDL 3 11/09/2023   VLDL 13.8 11/09/2023   LDLCALC 85 11/09/2023   LDLCALC 78 08/21/2022   Lab Results  Component Value Date   TSH 0.800 07/11/2023   TSH 0.78 02/16/2022    Therapeutic Level Labs: No results found for: "LITHIUM" No results found for: "VALPROATE" No results found for:  "CBMZ"  Current Medications: Current Outpatient Medications  Medication Sig Dispense Refill   acyclovir (ZOVIRAX) 800 MG tablet Take 1 tablet (800 mg total) by mouth daily. (Patient taking differently: Take 800 mg by mouth as needed (out breaks).) 90 tablet 1   albuterol (VENTOLIN HFA) 108 (90 Base) MCG/ACT inhaler Inhale 2 puffs into the lungs every 6 (six) hours as needed for wheezing or shortness of breath (Cough). 54 g 1   amLODipine (NORVASC) 10 MG tablet Take 1 tablet by mouth once daily 90 tablet 0   amoxicillin-clavulanate (AUGMENTIN) 875-125 MG tablet Take 1 tablet by mouth 2 (two) times daily. 20 tablet 0   atorvastatin (LIPITOR) 20 MG tablet Take 1 tablet (20 mg total) by mouth daily. 90 tablet 1   benzonatate (  TESSALON) 200 MG capsule Take 1 capsule (200 mg total) by mouth 2 (two) times daily as needed for cough. 20 capsule 0   cetirizine (ZYRTEC ALLERGY) 10 MG tablet Take 1 tablet (10 mg total) by mouth at bedtime. 90 tablet 1   diclofenac (VOLTAREN) 75 MG EC tablet Take 1 tablet (75 mg total) by mouth 2 (two) times daily. 30 tablet 0   esomeprazole (NEXIUM) 40 MG capsule Take 1 capsule (40 mg total) by mouth daily at 12 noon. 90 capsule 1   FLUoxetine (PROZAC) 10 MG capsule Take 1 capsule (10 mg total) by mouth daily. 90 capsule 1   fluticasone (FLONASE) 50 MCG/ACT nasal spray Place 2 sprays into both nostrils daily. 16 g 6   gabapentin (NEURONTIN) 300 MG capsule Take 1 capsule (300 mg total) by mouth 3 (three) times daily. 180 capsule 3   losartan-hydrochlorothiazide (HYZAAR) 100-25 MG tablet Take 1 tablet by mouth daily. 90 tablet 1   No current facility-administered medications for this visit.     Musculoskeletal: Strength & Muscle Tone: within normal limits Gait & Station: normal Patient leans: N/A  Psychiatric Specialty Exam: Review of Systems  Blood pressure (!) 167/70, pulse 82, height 5\' 11"  (1.803 m), weight 244 lb (110.7 kg).Body mass index is 34.03 kg/m.   General Appearance: Casual  Eye Contact:  Good  Speech:  Clear and Coherent  Volume:  Normal  Mood:  Anxious and Depressed  Affect:  Congruent  Thought Process:  Coherent  Orientation:  Full (Time, Place, and Person)  Thought Content: Logical   Suicidal Thoughts:  No  Homicidal Thoughts:  No  Memory:  Immediate;   Good Recent;   Good  Judgement:  Good  Insight:  Good  Psychomotor Activity:  Normal  Concentration:  Concentration: Good  Recall:  Good  Fund of Knowledge: Good  Language: Good  Akathisia:  No  Handed:  Right  AIMS (if indicated): not done  Assets:  Communication Skills Desire for Improvement Resilience Social Support  ADL's:  Intact  Cognition: WNL  Sleep:  Good   Screenings: GAD-7    Flowsheet Row Office Visit from 11/07/2023 in [facility] at [facility] Office Visit from 08/21/2022 in [facility] at [facility] Office Visit from 05/22/2022 in [facility] Primary Care at [facility]  Total GAD-7 Score 18 17 18       PHQ2-9    Flowsheet Row Office Visit from 11/07/2023 in [facility] at [facility] Office Visit from [facility] Office Visit from 08/21/2022 in [facility] at [facility] Office Visit from 05/22/2022 in [facility] Care at [facility]  PHQ-2 Total Score 6 6 5 5   PHQ-9 Total Score 24 27 19 19       Flowsheet Row Office Visit from 10/08/2023 in [facility] ED from 09[facility]03/2022 in [facility] ED from 10/31/2021 in [facility] Emergency Department at [facility]  C-SSRS RISK CATEGORY Error: Q3, 4, or 5 should not be populated when Q2 is No No Risk No Risk        Assessment and Plan: Denise Mejia 63 year old African-American female presents for  medication management follow-up appointment patient to continue Prozac 10 mg daily she reports her mood has stabilized with medication.  Denying any medication side effects.  She  reports a good appetite.  States she is resting well throughout the night.  Will continue to monitor symptoms.  Support, encouragement and reassurance was provided.  Patient encouraged to follow-up with therapy services.  - Continue Prozac 10 mg daily  Collaboration of Care: Collaboration of Care: Other follow-up with therapy services  Patient/Guardian was advised Release of Information must be obtained prior to any record release in order to collaborate their care with an outside provider. Patient/Guardian was advised if they have not already done so to contact the registration department to sign all necessary forms in order for us  to release information regarding their care.   Consent: Patient/Guardian gives verbal consent for treatment and assignment of benefits for services provided during this visit. Patient/Guardian expressed understanding and agreed to proceed.    Levester Reagin, NP 12/17/2023, 3:49 PM

## 2024-01-01 ENCOUNTER — Ambulatory Visit: Admitting: Cardiology

## 2024-02-08 ENCOUNTER — Ambulatory Visit: Admitting: Cardiology

## 2024-02-19 ENCOUNTER — Ambulatory Visit: Attending: Cardiology | Admitting: Cardiology

## 2024-02-19 ENCOUNTER — Encounter: Payer: Self-pay | Admitting: Cardiology

## 2024-02-19 VITALS — BP 130/64 | HR 67 | Ht 71.0 in | Wt 235.0 lb

## 2024-02-19 DIAGNOSIS — Z6830 Body mass index (BMI) 30.0-30.9, adult: Secondary | ICD-10-CM | POA: Diagnosis not present

## 2024-02-19 DIAGNOSIS — I10 Essential (primary) hypertension: Secondary | ICD-10-CM

## 2024-02-19 DIAGNOSIS — E782 Mixed hyperlipidemia: Secondary | ICD-10-CM

## 2024-02-19 DIAGNOSIS — E66811 Obesity, class 1: Secondary | ICD-10-CM

## 2024-02-19 DIAGNOSIS — E6609 Other obesity due to excess calories: Secondary | ICD-10-CM | POA: Diagnosis not present

## 2024-02-19 NOTE — Progress Notes (Signed)
 Cardiology Office Note:    Date:  02/19/2024   ID:  Denise Mejia, DOB 1960/10/13, MRN [id number]  PCP:  Everlina Hock, NP  Cardiologist:  Nelia Balzarine, MD   Referring MD: Everlina Hock, NP    ASSESSMENT:    1. Essential hypertension   2. Class 1 obesity due to excess calories with serious comorbidity and body mass index (BMI) of 30.0 to 30.9 in adult   3. Mixed hyperlipidemia    PLAN:    In order of problems listed above:  Primary prevention stressed with the patient.  Importance of compliance with diet medication stressed and patient verbalized standing. Essential hypertension: Blood pressure is stable and diet was emphasized. Mixed dyslipidemia: On lipid-lowering medications followed by primary care. Elevated hemoglobin A1c: I discussed this with her at length and questions advised.  Diet emphasized. Obesity: Reduction stressed risks of obesity explained and she was advised to walk at least half an hour a day on a daily basis and she promises to do so. Patient will be seen in follow-up appointment in 6 months or earlier if the patient has any concerns.    Medication Adjustments/Labs and Tests Ordered: Current medicines are reviewed at length with the patient today.  Concerns regarding medicines are outlined above.  No orders of the defined types were placed in this encounter.  No orders of the defined types were placed in this encounter.    Chief Complaint  Patient presents with   Follow-up     History of Present Illness:    Denise Mejia is a 63 y.o. female.  Patient has past medical history of palpitations, essential hypertension, mixed dyslipidemia and obesity.  Her hemoglobin A1c is elevated.  She denies any chest pain orthopnea or PND.  She leads a sedentary lifestyle.  She is previously unknown to me.  At the time of my evaluation, the patient is alert awake oriented and in no distress.  I reviewed previous notes.  Past Medical History:  Diagnosis Date    Angina at rest [facility]) 04/03/2020   Anxiety    Arthralgia of both hands 11/09/2016   Arthritis    Borderline diabetes 05/22/2022   Bronchitis    reports hx of frequent   Carpal tunnel syndrome 02/08/2013   Cervical radiculopathy 02/12/2020   Chest pain 04/03/2020   Chronic midline low back pain with bilateral sciatica 02/16/2022   Class 1 obesity due to excess calories with serious comorbidity and body mass index (BMI) of 30.0 to 30.9 in adult 09/19/2018   Depression    pt states she does not have history of depresssion 02-18-2014   Essential hypertension 05/14/2013   Excessive daytime sleepiness 03/22/2017   Facet arthropathy, lumbosacral 04/03/2022   Fibroid    GERD (gastroesophageal reflux disease)    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 Formatting of this note might be different from the original. Last Assessment & Plan: Improving with sertraline, continue at current dose with follow up in 3 months. Last Assessment & Plan: Formatting of this note might be different from the original. Improving with sertraline, continue at current dose with follow up in 3 months. Format   Murmur 05/14/2013   Obesity (BMI 30-39.9) 02/16/2022   Sacroiliac joint dysfunction of both sides 02/24/2022   Sleep  apnea    Vitamin D  deficiency 08/21/2022    Past Surgical History:  Procedure Laterality Date   CARPAL TUNNEL RELEASE     bilateral   INDUCED ABORTION     X2   TOTAL ABDOMINAL HYSTERECTOMY      Current Medications: Current Meds  Medication Sig   acyclovir  (ZOVIRAX ) 800 MG tablet Take 1 tablet (800 mg total) by mouth daily. (Patient taking differently: Take 800 mg by mouth as needed (out breaks).)   albuterol  (VENTOLIN  HFA) 108 (90 Base) MCG/ACT inhaler Inhale 2 puffs into  the lungs every 6 (six) hours as needed for wheezing or shortness of breath (Cough).   amLODipine  (NORVASC ) 10 MG tablet Take 1 tablet by mouth once daily   amoxicillin -clavulanate (AUGMENTIN ) 875-125 MG tablet Take 1 tablet by mouth 2 (two) times daily.   atorvastatin  (LIPITOR) 20 MG tablet Take 1 tablet (20 mg total) by mouth daily.   cetirizine  (ZYRTEC  ALLERGY) 10 MG tablet Take 1 tablet (10 mg total) by mouth at bedtime.   diclofenac  (VOLTAREN ) 75 MG EC tablet Take 1 tablet (75 mg total) by mouth 2 (two) times daily.   esomeprazole  (NEXIUM ) 40 MG capsule Take 1 capsule (40 mg total) by mouth daily at 12 noon.   FLUoxetine  (PROZAC ) 10 MG capsule Take 1 capsule (10 mg total) by mouth daily.   fluticasone  (FLONASE ) 50 MCG/ACT nasal spray Place 2 sprays into both nostrils daily.   gabapentin  (NEURONTIN ) 300 MG capsule Take 1 capsule (300 mg total) by mouth 3 (three) times daily.   losartan -hydrochlorothiazide (HYZAAR) 100-25 MG tablet Take 1 tablet by mouth daily.     Allergies:   Lisinopril   Social History   Socioeconomic History   Marital status: Single    Spouse name: Not on file   Number of children: 1   Years of education: Not on file   Highest education level: Not on file  Occupational History    Comment: Nurses aide  Tobacco Use   Smoking status: Never   Smokeless tobacco: Never  Vaping Use   Vaping status: Never Used  Substance and Sexual Activity   Alcohol use: No   Drug use: No   Sexual activity: Not Currently    Birth control/protection: Surgical  Other Topics Concern   Not on file  Social History Narrative   Not on file   Social Drivers of Health   Financial Resource Strain: Low Risk  (07/01/2021)   [facility]   Overall Financial Resource Strain (CARDIA)    Difficulty of Paying Living Expenses: Not hard at all  Food Insecurity: No Food Insecurity (07/01/2021)   Received from [facility]   Hunger Vital Sign    Within the past 12 months,  you worried that your food would run out before you got the money to buy more.: Never true    Within the past 12 months, the food you bought just didn't last and you didn't have money to get more.: Never true  Transportation Needs: No Transportation Needs (07/01/2021)   Received from [facility] - Transportation    Lack of Transportation (Medical): No    Lack of Transportation (Non-Medical): No  Physical Activity: Not on file  Stress: No Stress Concern Present (07/01/2021)   Received from [facility] - Occupational Stress Questionnaire    Feeling of Stress : Not at all  Social Connections: Unknown (01/14/2022)   Received from [facility]  Social Network    Social Network: Not on file     Family History: The patient's family history includes Hypertension in her sister; Lung cancer in her father; Stroke in her mother. There is no history of Colon cancer, Esophageal cancer, or Stomach cancer.  ROS:   Please see the history of present illness.    All other systems reviewed and are negative.  EKGs/Labs/Other Studies Reviewed:    The following studies were reviewed today: .Aaron Aas  I discussed my findings with the patient at length    Recent Labs: 07/11/2023: BUN 12; Creatinine, Ser 0.86; Magnesium  2.1; Potassium 4.0; Sodium 139; TSH 0.800  Recent Lipid Panel    Component Value Date/Time   CHOL 150 11/09/2023 0733   TRIG 69.0 11/09/2023 0733   HDL 50.70 11/09/2023 0733   CHOLHDL 3 11/09/2023 0733   VLDL 13.8 11/09/2023 0733   LDLCALC 85 11/09/2023 0733    Physical Exam:    VS:  BP 130/64   Pulse 67   Ht 5' 11 (1.803 m)   Wt 235 lb (106.6 kg)   SpO2 93%   BMI 32.78 kg/m     Wt Readings from Last 3 Encounters:  02/19/24 235 lb (106.6 kg)  12/05/23 236 lb (107 kg)  11/07/23 239 lb (108.4 kg)     GEN: Patient is in no acute distress HEENT: Normal NECK: No JVD; No carotid bruits LYMPHATICS: No  lymphadenopathy CARDIAC: Hear sounds regular, 2/6 systolic murmur at the apex. RESPIRATORY:  Clear to auscultation without rales, wheezing or rhonchi  ABDOMEN: Soft, non-tender, non-distended MUSCULOSKELETAL:  No edema; No deformity  SKIN: Warm and dry NEUROLOGIC:  Alert and oriented x 3 PSYCHIATRIC:  Normal affect   Signed, Nelia Balzarine, MD  02/19/2024 2:46 PM    [facility]

## 2024-02-19 NOTE — Patient Instructions (Addendum)
 Medication Instructions:  No changes *If you need a refill on your cardiac medications before your next appointment, please call your pharmacy*  Lab Work: No labs  Testing/Procedures: No testing  Follow-Up: At [facility], you and your health needs are our priority.  As part of our continuing mission to provide you with exceptional heart care, our providers are all part of one team.  This team includes your primary Cardiologist (physician) and Advanced Practice Providers or APPs (Physician Assistants and Nurse Practitioners) who all work together to provide you with the care you need, when you need it.  Your next appointment:   1 year(s)  Provider:   Hillis Lu, MD    We recommend signing up for the patient portal called MyChart.  Sign up information is provided on this After Visit Summary.  MyChart is used to connect with patients for Virtual Visits (Telemedicine).  Patients are able to view lab/test results, encounter notes, upcoming appointments, etc.  Non-urgent messages can be sent to your provider as well.   To learn more about what you can do with MyChart, go to [email or web address].

## 2024-03-18 ENCOUNTER — Other Ambulatory Visit: Payer: Self-pay | Admitting: Family Medicine

## 2024-03-23 ENCOUNTER — Other Ambulatory Visit: Payer: Self-pay | Admitting: Family Medicine

## 2024-03-23 DIAGNOSIS — R768 Other specified abnormal immunological findings in serum: Secondary | ICD-10-CM

## 2024-03-25 ENCOUNTER — Other Ambulatory Visit (HOSPITAL_COMMUNITY): Payer: Self-pay

## 2024-03-31 ENCOUNTER — Ambulatory Visit (INDEPENDENT_AMBULATORY_CARE_PROVIDER_SITE_OTHER): Admitting: Podiatry

## 2024-03-31 ENCOUNTER — Encounter: Payer: Self-pay | Admitting: Podiatry

## 2024-03-31 ENCOUNTER — Ambulatory Visit (INDEPENDENT_AMBULATORY_CARE_PROVIDER_SITE_OTHER)

## 2024-03-31 DIAGNOSIS — G8929 Other chronic pain: Secondary | ICD-10-CM | POA: Diagnosis not present

## 2024-03-31 DIAGNOSIS — M79672 Pain in left foot: Secondary | ICD-10-CM

## 2024-03-31 DIAGNOSIS — M722 Plantar fascial fibromatosis: Secondary | ICD-10-CM

## 2024-03-31 MED ORDER — DICLOFENAC SODIUM 75 MG PO TBEC: 75.0000 mg | DELAYED_RELEASE_TABLET | Freq: Two times a day (BID) | ORAL | 2 refills | Status: DC

## 2024-03-31 MED ORDER — TRIAMCINOLONE ACETONIDE 10 MG/ML IJ SUSP
10.0000 mg | Freq: Once | INTRAMUSCULAR | Status: AC
Start: 2024-03-31 — End: 2024-03-31
  Administered 2024-03-31: 10 mg via INTRA_ARTICULAR

## 2024-03-31 NOTE — Patient Instructions (Signed)

## 2024-04-01 NOTE — Progress Notes (Signed)
 Subjective:   Patient ID: Denise Mejia, female   DOB: 63 y.o.   MRN: [id number]   HPI Patient presents stating that she has been having severe pain in her left heel and that it has been throbbing hard to walk on and is worse after periods of rest.  States that she has tried inserts and other modalities and is not getting relief and it has been present for several months and patient does not smoke likes to be active   Review of Systems  All other systems reviewed and are negative.       Objective:  Physical Exam Vitals and nursing note reviewed.  Constitutional:      Appearance: She is well-developed.  Pulmonary:     Effort: Pulmonary effort is normal.  Musculoskeletal:        General: Normal range of motion.  Skin:    General: Skin is warm.  Neurological:     Mental Status: She is alert.     Neurovascular status found to be intact muscle strength found to be adequate range of motion within normal limits.  Patient is found to have acute discomfort plantar aspect left heel at the insertional point tendon into the calcaneus with fluid buildup and flattening of the arch noted.  Patient is found to have good digital perfusion well oriented x 3     Assessment:  Acute plantar fasciitis left with inflammation fluid of the medial band at insertion     Plan:  H&P done x-rays taken reviewed in today I went ahead did sterile prep injected the plantar fascia at insertion 3 mg Kenalog  5 mg Xylocaine  and applied fascial brace properly fitted to lift the arch and take stress off the arch.  Placed on oral anti-inflammatory gave instructions on usage and shoe gear modifications.  Reappoint 2 weeks  X-rays indicate spur formation no indication stress fracture or arthritis

## 2024-04-10 ENCOUNTER — Telehealth: Payer: Self-pay | Admitting: Podiatry

## 2024-04-10 NOTE — Telephone Encounter (Signed)
 Patient called as she is still experiencing pain in her left heel. Patient unable to make appointment at this time due to financial reasons. She is wanting to know if there is anything she can do at home to help with the pain. Thanks!

## 2024-04-11 NOTE — Telephone Encounter (Signed)
 Spoke to patient and advised.

## 2024-04-11 NOTE — Telephone Encounter (Signed)
 Continue voltaren  and ice and can purchase night splint online to sleep in

## 2024-04-21 ENCOUNTER — Telehealth: Payer: Self-pay | Admitting: Neurology

## 2024-04-21 DIAGNOSIS — R200 Anesthesia of skin: Secondary | ICD-10-CM

## 2024-04-21 NOTE — Telephone Encounter (Signed)
 Looks like she was referred to Neurology back in December. Okay to place new referral?

## 2024-04-21 NOTE — Telephone Encounter (Signed)
 Patient made aware.

## 2024-04-21 NOTE — Telephone Encounter (Signed)
 Copied from CRM #[id number]. Topic: Referral - Request for Referral >> Apr 21, 2024 12:46 PM Mercedes MATSU wrote: Did the patient discuss referral with their provider in the last year? Yes (If No - schedule appointment) (If Yes - send message)  Appointment offered? No  Type of order/referral and detailed reason for visit: Neurology  Preference of office, provider, location: Patient is requesting to go to the neurologist on [address]  If referral order, have you been seen by this specialty before? No (If Yes, this issue or another issue? When? Where?  Can we respond through MyChart? Yes, patient requesting a call back [phone]

## 2024-04-26 ENCOUNTER — Encounter (HOSPITAL_BASED_OUTPATIENT_CLINIC_OR_DEPARTMENT_OTHER): Payer: Self-pay | Admitting: Emergency Medicine

## 2024-04-26 ENCOUNTER — Other Ambulatory Visit: Payer: Self-pay

## 2024-04-26 ENCOUNTER — Emergency Department (HOSPITAL_BASED_OUTPATIENT_CLINIC_OR_DEPARTMENT_OTHER): Admission: EM | Admit: 2024-04-26 | Discharge: 2024-04-26 | Disposition: A | Discharge status: Home / Self Care

## 2024-04-26 ENCOUNTER — Emergency Department (HOSPITAL_BASED_OUTPATIENT_CLINIC_OR_DEPARTMENT_OTHER)

## 2024-04-26 DIAGNOSIS — M79672 Pain in left foot: Secondary | ICD-10-CM | POA: Diagnosis not present

## 2024-04-26 DIAGNOSIS — M7732 Calcaneal spur, left foot: Secondary | ICD-10-CM | POA: Diagnosis not present

## 2024-04-26 DIAGNOSIS — M7989 Other specified soft tissue disorders: Secondary | ICD-10-CM | POA: Diagnosis not present

## 2024-04-26 DIAGNOSIS — X501XXA Overexertion from prolonged static or awkward postures, initial encounter: Secondary | ICD-10-CM | POA: Insufficient documentation

## 2024-04-26 DIAGNOSIS — M19072 Primary osteoarthritis, left ankle and foot: Secondary | ICD-10-CM | POA: Diagnosis not present

## 2024-04-26 DIAGNOSIS — R200 Anesthesia of skin: Secondary | ICD-10-CM | POA: Diagnosis not present

## 2024-04-26 DIAGNOSIS — R202 Paresthesia of skin: Secondary | ICD-10-CM | POA: Diagnosis not present

## 2024-04-26 MED ORDER — IBUPROFEN 400 MG PO TABS: 600.0000 mg | ORAL_TABLET | Freq: Once | ORAL | Status: DC

## 2024-04-26 MED ORDER — ACETAMINOPHEN 500 MG PO TABS
1000.0000 mg | ORAL_TABLET | Freq: Once | ORAL | Status: AC
  Administered 2024-04-26: 1000 mg via ORAL
  Filled 2024-04-26: qty 2

## 2024-04-26 NOTE — ED Provider Notes (Signed)
 [facility] Provider Note   CSN: [id number] Arrival date & time: 04/26/24  1635     Patient presents with: Numbness and Foot Pain  HPI Denise Mejia is a 64 y.o. female presenting for left foot pain and numbness.  Has been going on for 3 to 4 weeks.  This was after getting a shot in her foot for her plantar fasciitis.  This was at the podiatrist office.  She states that the numbness is about the lateral aspect of the foot starting at the midfoot extending down to the little toe. About 2 weeks ago she also twisted her left ankle and has had more pain about the medial aspect of the foot.  Still able to ambulate and bear weight.  Has been taking ibuprofen  intermittently which has helped somewhat.    Foot Pain       Prior to Admission medications   Medication Sig Start Date End Date Taking? Authorizing Provider  acyclovir  (ZOVIRAX ) 800 MG tablet Take 1 tablet by mouth once daily 03/24/24   Beck, Taylor B, NP  albuterol  (VENTOLIN  HFA) 108 (90 Base) MCG/ACT inhaler Inhale 2 puffs into the lungs every 6 (six) hours as needed for wheezing or shortness of breath (Cough). 12/05/23   Almarie Waddell NOVAK, NP  amLODipine  (NORVASC ) 10 MG tablet Take 1 tablet by mouth once daily 03/18/24   Beck, Taylor B, NP  amoxicillin -clavulanate (AUGMENTIN ) 875-125 MG tablet Take 1 tablet by mouth 2 (two) times daily. 12/05/23   Almarie Waddell NOVAK, NP  atorvastatin  (LIPITOR) 20 MG tablet Take 1 tablet (20 mg total) by mouth daily. 09/04/23   Almarie Waddell NOVAK, NP  cetirizine  (ZYRTEC  ALLERGY) 10 MG tablet Take 1 tablet (10 mg total) by mouth at bedtime. 11/07/23 05/05/24  Almarie Waddell NOVAK, NP  diclofenac  (VOLTAREN ) 75 MG EC tablet Take 1 tablet (75 mg total) by mouth 2 (two) times daily. 08/28/23   Almarie Waddell NOVAK, NP  diclofenac  (VOLTAREN ) 75 MG EC tablet Take 1 tablet (75 mg total) by mouth 2 (two) times daily. 03/31/24   Magdalen Pasco RAMAN, DPM  esomeprazole  (NEXIUM ) 40 MG capsule Take 1 capsule  (40 mg total) by mouth daily at 12 noon. 09/04/23   Almarie Waddell NOVAK, NP  FLUoxetine  (PROZAC ) 10 MG capsule Take 1 capsule (10 mg total) by mouth daily. 12/17/23 12/16/24  Ezzard Staci SAILOR, NP  fluticasone  (FLONASE ) 50 MCG/ACT nasal spray Place 2 sprays into both nostrils daily. 12/05/23   Almarie Waddell NOVAK, NP  gabapentin  (NEURONTIN ) 300 MG capsule Take 1 capsule (300 mg total) by mouth 3 (three) times daily. 08/28/23   Almarie Waddell NOVAK, NP  losartan -hydrochlorothiazide (HYZAAR) 100-25 MG tablet Take 1 tablet by mouth once daily 03/18/24   Beck, Taylor B, NP    Allergies: Lisinopril    Review of Systems See HPI  Updated Vital Signs BP (!) 159/69 (BP Location: Right Arm)   Pulse 61   Temp 98.2 F (36.8 C) (Oral)   Resp 16   Ht 6' 1 (1.854 m)   Wt 104.3 kg   SpO2 100%   BMI 30.34 kg/m   Physical Exam Constitutional:      Appearance: Normal appearance.  HENT:     Head: Normocephalic.     Nose: Nose normal.  Eyes:     Conjunctiva/sclera: Conjunctivae normal.  Pulmonary:     Effort: Pulmonary effort is normal.  Feet:     Comments: Some tenderness with palpation generally about  the medial aspect of the left foot.  No notable swelling erythema or deformity.  Able to ambulate and bear weight.  Pedal pulses 2+.  Sensation intact to light touch distally about all the toes.  Range of motion of the ankle and toes appears to be intact Neurological:     Mental Status: She is alert.  Psychiatric:        Mood and Affect: Mood normal.     (all labs ordered are listed, but only abnormal results are displayed) Labs Reviewed - No data to display  EKG: None  Radiology: DG Foot Complete Left Result Date: 04/26/2024 CLINICAL DATA:  Left foot pain since an injection for plantar fasciitis. Twisted the foot 2 weeks ago. Lateral numbness. EXAM: LEFT FOOT - COMPLETE 3+ VIEW COMPARISON:  03/31/2024 FINDINGS: Degenerative changes in the intertarsal joints. 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  Plantar and Achilles calcaneal spurs. No evidence of acute fracture or dislocation. No focal bone lesion or bone destruction. Mild soft tissue swelling along the dorsum of the foot. No radiopaque soft tissue foreign bodies or soft tissue gas. IMPRESSION: Degenerative changes in the left foot. Mild soft tissue swelling. No acute bony abnormalities. Electronically Signed   By: Elsie Gravely M.D.   On: 04/26/2024 17:38     Procedures   Medications Ordered in the ED  acetaminophen  (TYLENOL ) tablet 1,000 mg (1,000 mg Oral Given 04/26/24 1715)    Clinical Course as of 04/26/24 1747  Sat Apr 26, 2024  1741 DG Foot Complete Left [JR]    Clinical Course User Index [JR] Lang Norleen POUR, PA-C                                 Medical Decision Making Amount and/or Complexity of Data Reviewed Radiology: ordered.  Risk OTC drugs.   63 year old well-appearing female presenting for left foot pain and numbness.  Exam notable for tenderness about the medial left foot but otherwise reassuring and she can ambulate and bear weight with no issue.  No signs of infection or obvious trauma.  X-ray showing degenerative changes in the left foot and a mild soft tissue swelling but no acute osseous abnormalities.  Advised that she continue conservative treatment at home with NSAIDs and to follow-up with podiatry.     Final diagnoses:  Left foot pain  Numbness of left foot    ED Discharge Orders     None          Lang Norleen POUR, PA-C 04/26/24 1747    Kammerer, Megan L, DO 04/27/24 [phone]

## 2024-04-26 NOTE — ED Notes (Signed)
 Complaining of pain to the same foot she's had issues with. Plantar fasciitis and recent trauma from rolling it and now it doesn't feel right. Wants eval for same.

## 2024-04-26 NOTE — Discharge Instructions (Addendum)
 Evaluation for your left foot pain and numbness were overall reassuring.  As we discussed please follow-up with podiatry.  I provided their contact information in your discharge summary.  In the meantime recommend applying ice 3-4 times a day for pain and you can take ibuprofen  and Tylenol  as well.

## 2024-04-26 NOTE — ED Triage Notes (Signed)
 Pt via POV c/o numbness to left lateral foot, ongoing x 3-4 weeks after getting a shot plantar fasciitis. She then twisted her left ankle about 2 weeks ago and has not felt normal sensation ever since. Pain rated 10/10. Pt ambulatory w/o difficulty at time of triage.

## 2024-05-06 ENCOUNTER — Ambulatory Visit: Payer: Self-pay | Admitting: Family Medicine

## 2024-05-06 ENCOUNTER — Ambulatory Visit: Admitting: Podiatry

## 2024-05-06 NOTE — Telephone Encounter (Signed)
 FYI Only or Action Required?: FYI only for provider.  Patient was last seen in primary care on 12/05/2023 by Almarie Waddell NOVAK, NP.  Called Nurse Triage reporting Foot Pain.  Symptoms began about a month ago.  Interventions attempted: Prescription medications: diclofenac  (VOLTAREN ) 75 MG EC tablet, gabapentin  (NEURONTIN ) 300 MG capsule.  Symptoms are: unchanged.  Triage Disposition: See PCP Within 2 Weeks  Patient/caregiver understands and will follow disposition?: Yes                Copied from CRM #[id number]. Topic: Clinical - Red Word Triage >> May 06, 2024 10:43 AM Berneda FALCON wrote: Red Word that prompted transfer to Nurse Triage: Patient states she is having severe foot pain. Hard to walk sometimes. States this has been ongoing for a while. When asked, she noted it was well over 2 weeks, but did not give a timeline for the pain.  Wanted to schedule with Waddell Almarie, but states she needs to see if she has a copay because she would be unable to pay if so. Reason for Disposition  Foot pain is a chronic symptom (recurrent or ongoing AND present > 4 weeks)  Answer Assessment - Initial Assessment Questions This RN offered to schedule pt an appt with PCP on Thursday, 9/4, and pt states she will call back after finding out the copay will be $50.   ONSET: When did the pain start?      3 weeks; pt states she went to a podiatrist for plantar fasciitis and received a shot in her heel; pt states she called them back today but she can't afford the copay of $100  LOCATION: Where is the pain located?      Outer part of left foot; from pinky down  PAIN: How bad is the pain?    (Scale 1-10; or mild, moderate, severe)     Tender to touch; painful to walk; 10/10 pain level constant; has an ACE wrap on it  OTHER SYMPTOMS: Do you have any other symptoms? (e.g., leg pain, rash, fever, numbness)     Numbness in left foot  Protocols used: Foot Pain-A-AH

## 2024-05-07 ENCOUNTER — Encounter: Payer: Self-pay | Admitting: Podiatry

## 2024-05-07 ENCOUNTER — Ambulatory Visit: Admitting: Podiatry

## 2024-05-07 DIAGNOSIS — M7752 Other enthesopathy of left foot: Secondary | ICD-10-CM

## 2024-05-07 DIAGNOSIS — M778 Other enthesopathies, not elsewhere classified: Secondary | ICD-10-CM

## 2024-05-07 MED ORDER — MELOXICAM 15 MG PO TABS: 15.0000 mg | ORAL_TABLET | Freq: Every day | ORAL | 0 refills | Status: AC

## 2024-05-07 NOTE — Progress Notes (Signed)
  Subjective:  Patient ID: Denise Mejia, female    DOB: 1961/08/30,   MRN: [id number]  Chief Complaint  Patient presents with   Foot Pain    My left foot hurts from the side into my arch.  I can hardly walk.  It's so tender.    63 y.o. female presents for concern of left foot pain that has been going on for about a month. She was initially seen by Dr. Magdalen for plantar fasciitis and given an injection. Since she has been getting more extreme pain on the outside distal part of her foot. Relates pins and needles an dvery sensitive to touch. She is borderline diabetic.    Denise Mejia Denies any other pedal complaints. Denies n/v/f/c.   Past Medical History:  Diagnosis Date   Angina at rest [facility]) 04/03/2020   Anxiety    Arthralgia of both hands 11/09/2016   Arthritis    Borderline diabetes 05/22/2022   Bronchitis    reports hx of frequent   Carpal tunnel syndrome 02/08/2013   Cervical radiculopathy 02/12/2020   Chest pain 04/03/2020   Chronic midline low back pain with bilateral sciatica 02/16/2022   Class 1 obesity due to excess calories with serious comorbidity and body mass index (BMI) of 30.0 to 30.9 in adult 09/19/2018   Depression    pt states she does not have history of depresssion 02-18-2014   Essential hypertension 05/14/2013   Excessive daytime sleepiness 03/22/2017   Facet arthropathy, lumbosacral 04/03/2022   Fibroid    GERD (gastroesophageal reflux disease)    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 Formatting of this note might be different from the original. Last Assessment & Plan: Improving with sertraline, continue at current dose with follow up in 3 months. Last Assessment & Plan: Formatting of this note might be different from the original. Improving with  sertraline, continue at current dose with follow up in 3 months. Format   Murmur 05/14/2013   Obesity (BMI 30-39.9) 02/16/2022   Sacroiliac joint dysfunction of both sides 02/24/2022   Sleep apnea    Vitamin D  deficiency 08/21/2022    Objective:  Physical Exam: Vascular: DP/PT pulses 2/4 bilateral. CFT <3 seconds. Normal hair growth on digits. No edema.  Skin. No lacerations or abrasions bilateral feet.  Musculoskeletal: MMT 5/5 bilateral lower extremities in DF, PF, Inversion and Eversion. Deceased ROM in DF of ankle joint. Tender to fifth metatarsal head area and ball of foot and along outside of fifth metatarsal. Exam limited due to pain.  Neurological: Sensation intact to light touch.   Assessment:   1. Capsulitis of left foot      Plan:  Patient was evaluated and treated and all questions answered. -Xrays reviewed from outside.. No acute fractures or dislocations noted. Some degenerative changes noted to the midfoot.  -Discussed treatement options for capsulitis of left foot ; risks, alternatives, and benefits explained..  -Dispensed CAM boot  Patient to wear at all times and instructed on use -Recommend protection, rest, ice, elevation daily until symptoms improve -Meloxicam  sent to pharmacy.  -Patient to return to office in 4 weeks for serial x-rays to assess healing  or sooner if condition worsens.    Asberry Failing, DPM

## 2024-05-09 ENCOUNTER — Ambulatory Visit (HOSPITAL_BASED_OUTPATIENT_CLINIC_OR_DEPARTMENT_OTHER): Admitting: Student

## 2024-05-14 ENCOUNTER — Ambulatory Visit (HOSPITAL_BASED_OUTPATIENT_CLINIC_OR_DEPARTMENT_OTHER): Admitting: Student

## 2024-05-21 ENCOUNTER — Encounter (HOSPITAL_BASED_OUTPATIENT_CLINIC_OR_DEPARTMENT_OTHER): Payer: Self-pay

## 2024-05-21 ENCOUNTER — Ambulatory Visit (HOSPITAL_BASED_OUTPATIENT_CLINIC_OR_DEPARTMENT_OTHER): Admitting: Student

## 2024-05-26 ENCOUNTER — Telehealth: Payer: Self-pay | Admitting: Podiatry

## 2024-05-26 NOTE — Telephone Encounter (Signed)
 error

## 2024-05-29 ENCOUNTER — Telehealth: Payer: Self-pay | Admitting: Podiatry

## 2024-05-29 ENCOUNTER — Ambulatory Visit

## 2024-05-29 NOTE — Telephone Encounter (Signed)
 Pt arrived for appt on 05/29/24 @ 5pm. Did not have copay. Pt stated that the same problem is happen to the other foot, we informed pt that she would  need to be seen, but pt stated that she doesn't have the money and would like some advise before being seen.

## 2024-05-30 NOTE — Telephone Encounter (Signed)
 Left detailed message for patient.

## 2024-06-13 ENCOUNTER — Encounter: Payer: Self-pay | Admitting: Neurology

## 2024-06-13 ENCOUNTER — Ambulatory Visit (INDEPENDENT_AMBULATORY_CARE_PROVIDER_SITE_OTHER): Admitting: Neurology

## 2024-06-13 VITALS — BP 158/73 | HR 84 | Ht 73.0 in | Wt 229.0 lb

## 2024-06-13 DIAGNOSIS — M79672 Pain in left foot: Secondary | ICD-10-CM

## 2024-06-13 DIAGNOSIS — R202 Paresthesia of skin: Secondary | ICD-10-CM | POA: Diagnosis not present

## 2024-06-13 DIAGNOSIS — M79671 Pain in right foot: Secondary | ICD-10-CM | POA: Diagnosis not present

## 2024-06-13 NOTE — Progress Notes (Signed)
 [facility] Clinic Note - Initial Visit   Date: 06/13/2024   Denise Mejia MRN: [id number] DOB: [birth year]   Dear Denise Mejia, Denise Mejia:  Thank you for your kind referral of Denise Mejia for consultation of bilateral feet pain. Although her history is well known to you, please allow us  to reiterate it for the purpose of our medical record. The patient was accompanied to the clinic by self.    Denise Mejia is a 63 y.o. right-handed female with hyperlipidemia, depression, and hypertension presenting for evaluation of bilateral feet pain.   IMPRESSION/PLAN: Bilateral feet paresthesias and achy pain.  Her neurological exam shows diminished sensation over the mid-sole of both feet.  Reflexes and strength is normal.  Ddx: S1 radiculopathy, TTS, less likely neuropathy.  She may also have overlapping musculoskeletal pathology as nerve pathology does not cause achy/throbbing pain.  I recommend NCS/EMG of the lower extremities to further evaluate her symptoms.  For pain, I offered to increase dose of gabapentin  or add duloxetine, which was declined.   Further recommendations pending results.   ------------------------------------------------------------- History of present illness: She reports having numbness and tingling involving the lateral sole of the left foot. 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  She has tried soaking her feet.  She saw podiary who tried an injection for plantar fasciitis and wore a boot which did not help.  Ibuprofen  provides some relief of pain.  She applies ace bandage to her foot for the achy pain.  She denies weakness, imbalance, or falls.   She denies low back pain or radicular leg pain.  She takes gabapentin  300mg  three times daily for several years for generalized nerve pain.  She cannot tell if it helps her feet pain.  She is trying to taper off it because of what she has read about long term side effects.    Nonsmoker, does not drink alcohol.  She lives alone.    Out-side paper records, electronic medical record, and images have been reviewed where available and summarized as:  Lab Results  Component Value Date   HGBA1C 6.4 08/28/2023   Lab Results  Component Value Date   TSH 0.800 07/11/2023    Past Medical History:  Diagnosis Date   Angina at rest 04/03/2020   Anxiety    Arthralgia of both hands 11/09/2016   Arthritis    Borderline diabetes 05/22/2022   Bronchitis    reports hx of frequent   Carpal tunnel syndrome 02/08/2013   Cervical radiculopathy 02/12/2020   Chest pain 04/03/2020   Chronic midline low back pain with bilateral sciatica 02/16/2022   Class 1 obesity due to excess calories with serious comorbidity and body mass index (BMI) of 30.0 to 30.9 in adult 09/19/2018   Depression    pt states she does not have history of depresssion 02-18-2014   Essential hypertension 05/14/2013   Excessive daytime sleepiness 03/22/2017   Facet arthropathy, lumbosacral 04/03/2022   Fibroid    GERD (gastroesophageal reflux disease)    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 Formatting of this note might be different from the original. Last Assessment & Plan: Improving with sertraline, continue at current dose with follow up in 3 months. Last Assessment & Plan: Formatting of this note might  be different from the original. Improving with sertraline, continue at current dose with follow up in 3 months. Format   Murmur 05/14/2013   Obesity (BMI 30-39.9) 02/16/2022   Sacroiliac joint dysfunction of both sides 02/24/2022   Sleep apnea    Vitamin D  deficiency 08/21/2022    Past Surgical History:  Procedure Laterality Date   CARPAL TUNNEL RELEASE     bilateral   INDUCED  ABORTION     X2   TOTAL ABDOMINAL HYSTERECTOMY       Medications:  Outpatient Encounter Medications as of 06/13/2024  Medication Sig   acyclovir  (ZOVIRAX ) 800 MG tablet Take 1 tablet by mouth once daily   albuterol  (VENTOLIN  HFA) 108 (90 Base) MCG/ACT inhaler Inhale 2 puffs into the lungs every 6 (six) hours as needed for wheezing or shortness of breath (Cough).   amLODipine  (NORVASC ) 10 MG tablet Take 1 tablet by mouth once daily   atorvastatin  (LIPITOR) 20 MG tablet Take 1 tablet (20 mg total) by mouth daily.   cetirizine  (ZYRTEC  ALLERGY) 10 MG tablet Take 1 tablet (10 mg total) by mouth at bedtime.   cholecalciferol (VITAMIN D3) 25 MCG (1000 UNIT) tablet Take 1,000 Units by mouth daily.   FLUoxetine  (PROZAC ) 10 MG capsule Take 1 capsule (10 mg total) by mouth daily.   gabapentin  (NEURONTIN ) 300 MG capsule Take 1 capsule (300 mg total) by mouth 3 (three) times daily.   losartan -hydrochlorothiazide (HYZAAR) 100-25 MG tablet Take 1 tablet by mouth once daily   meloxicam  (MOBIC ) 15 MG tablet Take 1 tablet (15 mg total) by mouth daily.   amoxicillin -clavulanate (AUGMENTIN ) 875-125 MG tablet Take 1 tablet by mouth 2 (two) times daily. (Patient not taking: Reported on 06/13/2024)   esomeprazole  (NEXIUM ) 40 MG capsule Take 1 capsule (40 mg total) by mouth daily at 12 noon. (Patient not taking: Reported on 06/13/2024)   fluticasone  (FLONASE ) 50 MCG/ACT nasal spray Place 2 sprays into both nostrils daily. (Patient not taking: Reported on 06/13/2024)   No facility-administered encounter medications on file as of 06/13/2024.    Allergies:  Allergies  Allergen Reactions   Lisinopril Other (See Comments) and Cough    cough     Family History: Family History  Problem Relation Age of Onset   Stroke Mother    Lung cancer Father    Hypertension Sister    Colon cancer Neg Hx    Esophageal cancer Neg Hx    Stomach cancer Neg Hx     Social History: Social History   Tobacco Use    Smoking status: Never   Smokeless tobacco: Never  Vaping Use   Vaping status: Never Used  Substance Use Topics   Alcohol use: No   Drug use: No   Social History   Social History Narrative   Are you right handed or left handed? Right   Are you currently employed ?    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    What type of home do you live in: 1 story or 2 story? one    Caffiene tea all day    Vital Signs:  BP (!) 158/73   Pulse 84   Ht 6' 1 (1.854 m)   Wt 229 lb (103.9 kg)   SpO2 99%   BMI 30.21 kg/m    Neurological Exam: MENTAL STATUS including orientation to time, place, person, recent and remote memory, attention span and concentration, language,  and fund of knowledge is normal.  Speech is not dysarthric.  CRANIAL NERVES: II:  No visual field defects.     III-IV-VI: Pupils equal round and reactive to light.  Normal conjugate, extra-ocular eye movements in all directions of gaze.  No nystagmus.  No ptosis.   V:  Normal facial sensation.    VII:  Normal facial symmetry and movements.   VIII:  Normal hearing and vestibular function.   IX-X:  Normal palatal movement.   XI:  Normal shoulder shrug and head rotation.   XII:  Normal tongue strength and range of motion, no deviation or fasciculation.  MOTOR:  No atrophy, fasciculations or abnormal movements.  No pronator drift.   Upper Extremity:  Right  Left  Deltoid  5/5   5/5   Biceps  5/5   5/5   Triceps  5/5   5/5   Wrist extensors  5/5   5/5   Wrist flexors  5/5   5/5   Finger extensors  5/5   5/5   Finger flexors  5/5   5/5   Dorsal interossei  5/5   5/5   Abductor pollicis  5/5   5/5   Tone (Ashworth scale)  0  0   Lower Extremity:  Right  Left  Hip flexors  5/5   5/5   Knee flexors  5/5   5/5   Knee extensors  5/5   5/5   Dorsiflexors  5/5   5/5   Plantarflexors  5/5   5/5   Toe extensors  5/5   5/5   Toe flexors  5/5   5/5   Tone (Ashworth scale)  0   0   MSRs:                                           Right        Left brachioradialis 2+  2+  biceps 2+  2+  triceps 2+  2+  patellar 2+  2+  ankle jerk 2+  2+  plantar response down  down   SENSORY:  Reduced temperature and pin prick over the mid-sole/arch bilaterally.  Sensation over the dorsum and lateral foot is normal.  Vibration is normal throughout.  COORDINATION/GAIT: Normal finger-to- nose-finger.  Intact rapid alternating movements bilaterally.  Gait narrow based and stable. Unassisted.     Thank you for allowing me to participate in patient's care.  If I can answer any additional questions, I would be pleased to do so.    Sincerely,    Susannah Carbin K. Tobie, DO

## 2024-06-13 NOTE — Patient Instructions (Signed)
 Nerve testing of the lower extremities.  Do not apply lotion, oil, or moisturizer to the legs and feet on the day of testing.    ELECTROMYOGRAM AND NERVE CONDUCTION STUDIES (EMG/NCS) INSTRUCTIONS  How to Prepare The neurologist conducting the EMG will need to know if you have certain medical conditions. Tell the neurologist and other EMG lab personnel if you: Have a pacemaker or any other electrical medical device Take blood-thinning medications Have hemophilia, a blood-clotting disorder that causes prolonged bleeding Bathing Take a shower or bath shortly before your exam in order to remove oils from your skin. Don't apply lotions or creams before the exam.  What to Expect You'll likely be asked to change into a hospital gown for the procedure and lie down on an examination table. The following explanations can help you understand what will happen during the exam.  Electrodes. The neurologist or a technician places surface electrodes at various locations on your skin depending on where you're experiencing symptoms. Or the neurologist may insert needle electrodes at different sites depending on your symptoms.  Sensations. The electrodes will at times transmit a tiny electrical current that you may feel as a twinge or spasm. The needle electrode may cause discomfort or pain that usually ends shortly after the needle is removed. If you are concerned about discomfort or pain, you may want to talk to the neurologist about taking a short break during the exam.  Instructions. During the needle EMG, the neurologist will assess whether there is any spontaneous electrical activity when the muscle is at rest - activity that isn't present in healthy muscle tissue - and the degree of activity when you slightly contract the muscle.  He or she will give you instructions on resting and contracting a muscle at appropriate times. Depending on what muscles and nerves the neurologist is examining, he or she may ask  you to change positions during the exam.  After your EMG You may experience some temporary, minor bruising where the needle electrode was inserted into your muscle. This bruising should fade within several days. If it persists, contact your primary care doctor.

## 2024-07-07 ENCOUNTER — Encounter: Payer: Self-pay | Admitting: Radiology

## 2024-08-21 ENCOUNTER — Encounter: Admitting: Neurology

## 2024-08-23 ENCOUNTER — Other Ambulatory Visit: Payer: Self-pay | Admitting: Family Medicine

## 2024-09-28 ENCOUNTER — Other Ambulatory Visit: Payer: Self-pay | Admitting: Family Medicine
